# Patient Record
Sex: Female | Born: 1990 | Race: White | Hispanic: No | Marital: Married | Smoking: Never smoker
Health system: Southern US, Community
[De-identification: ages and names within clinical notes are randomized; demographics above are authoritative.]

## PROBLEM LIST (undated history)

## (undated) ENCOUNTER — Inpatient Hospital Stay (HOSPITAL_COMMUNITY): Payer: Self-pay

## (undated) DIAGNOSIS — R002 Palpitations: Secondary | ICD-10-CM

## (undated) DIAGNOSIS — L509 Urticaria, unspecified: Secondary | ICD-10-CM

## (undated) DIAGNOSIS — K2270 Barrett's esophagus without dysplasia: Secondary | ICD-10-CM

## (undated) DIAGNOSIS — G43909 Migraine, unspecified, not intractable, without status migrainosus: Secondary | ICD-10-CM

## (undated) DIAGNOSIS — F411 Generalized anxiety disorder: Secondary | ICD-10-CM

## (undated) DIAGNOSIS — K449 Diaphragmatic hernia without obstruction or gangrene: Secondary | ICD-10-CM

## (undated) DIAGNOSIS — F32A Depression, unspecified: Secondary | ICD-10-CM

## (undated) DIAGNOSIS — F419 Anxiety disorder, unspecified: Secondary | ICD-10-CM

## (undated) DIAGNOSIS — K219 Gastro-esophageal reflux disease without esophagitis: Secondary | ICD-10-CM

## (undated) DIAGNOSIS — L501 Idiopathic urticaria: Secondary | ICD-10-CM

## (undated) HISTORY — PX: TONSILLECTOMY: SUR1361

## (undated) HISTORY — DX: Urticaria, unspecified: L50.9

## (undated) HISTORY — DX: Diaphragmatic hernia without obstruction or gangrene: K44.9

## (undated) HISTORY — DX: Gastro-esophageal reflux disease without esophagitis: K21.9

## (undated) HISTORY — PX: WISDOM TOOTH EXTRACTION: SHX21

## (undated) HISTORY — PX: MYRINGOTOMY WITH TUBE PLACEMENT: SHX5663

## (undated) HISTORY — DX: Barrett's esophagus without dysplasia: K22.70

---
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
## 2011-10-12 ENCOUNTER — Emergency Department (HOSPITAL_COMMUNITY)
Admission: EM | Admit: 2011-10-12 | Discharge: 2011-10-13 | Disposition: A | Discharge status: Home / Self Care | Payer: PRIVATE HEALTH INSURANCE | Attending: Emergency Medicine | Admitting: Emergency Medicine

## 2011-10-12 ENCOUNTER — Encounter (HOSPITAL_COMMUNITY): Payer: Self-pay | Admitting: *Deleted

## 2011-10-12 DIAGNOSIS — M542 Cervicalgia: Secondary | ICD-10-CM | POA: Insufficient documentation

## 2011-10-12 MED ORDER — DEXAMETHASONE SODIUM PHOSPHATE 10 MG/ML IJ SOLN
10.0000 mg | Freq: Once | INTRAMUSCULAR | Status: AC
  Administered 2011-10-12: 10 mg via INTRAMUSCULAR
  Filled 2011-10-12: qty 1

## 2011-10-12 MED ORDER — DIAZEPAM 5 MG PO TABS
5.0000 mg | ORAL_TABLET | Freq: Once | ORAL | Status: AC
  Administered 2011-10-12: 5 mg via ORAL
  Filled 2011-10-12: qty 1

## 2011-10-12 MED ORDER — KETOROLAC TROMETHAMINE 60 MG/2ML IM SOLN
60.0000 mg | Freq: Once | INTRAMUSCULAR | Status: AC
  Administered 2011-10-12: 60 mg via INTRAMUSCULAR
  Filled 2011-10-12: qty 2

## 2011-10-12 NOTE — ED Provider Notes (Signed)
History     CSN: [id number]  Arrival date & time 10/12/11  2039   First MD Initiated Contact with Patient 10/12/11 2308     11:29 PM HPI Patient reports yesterday began having left-sided neck pain. Reports only activity that may have aggravated this is lifting heavy boxes. Reports severe pain on left side of neck with bending chin to chest and turning her head to the left. Denies eye pain, headache, fever, midline neck pain Patient is a 21 y.o. female presenting with neck injury. The history is provided by the patient.  Neck Injury This is a new problem. The current episode started yesterday. The problem occurs constantly. The problem has been gradually worsening. Associated symptoms include neck pain. Pertinent negatives include no abdominal pain, chest pain, chills, coughing, fatigue, fever, headaches, nausea, numbness, rash, sore throat, vomiting or weakness. Exacerbated by: Movement of head. She has tried acetaminophen, NSAIDs, ice, heat and rest for the symptoms. The treatment provided no relief.    History reviewed. No pertinent past medical history.  Past Surgical History  Procedure Date  . Tonsillectomy     No family history on file.  History  Substance Use Topics  . Smoking status: Never Smoker   . Smokeless tobacco: Not on file  . Alcohol Use: Yes     occasionally    OB History    Grav Para Term Preterm Abortions TAB SAB Ect Mult Living                  Review of Systems  Constitutional: Negative for fever, chills and fatigue.  HENT: Positive for neck pain and neck stiffness (left side). Negative for sore throat.   Eyes: Negative for pain.  Respiratory: Negative for cough and shortness of breath.   Cardiovascular: Negative for chest pain.  Gastrointestinal: Negative for nausea, vomiting, abdominal pain and diarrhea.  Skin: Negative for rash.  Neurological: Positive for light-headedness. Negative for seizures, weakness, numbness and headaches.  All other systems  reviewed and are negative.    Allergies  Review of patient's allergies indicates no known allergies.  Home Medications  No current outpatient prescriptions on file.  BP 124/74  Pulse 84  Temp(Src) 98.2 F (36.8 C) (Oral)  Resp 18  SpO2 100%  LMP 08/28/2011  Physical Exam  Vitals reviewed. Constitutional: She is oriented to person, place, and time. Vital signs are normal. She appears well-developed and well-nourished.  HENT:  Head: Normocephalic and atraumatic.  Eyes: Conjunctivae are normal. Pupils are equal, round, and reactive to light.  Neck: Neck supple. No spinous process tenderness and no muscular tenderness present. No rigidity. Decreased range of motion present. No edema present.       Patient has full range of motion of neck. No meningeal signs. Pain worsens with movement of head course the left the chest. Patient does not however have midline neck tenderness., weakness no numbness, tingling, weakness distally.  Cardiovascular: Normal rate, regular rhythm and normal heart sounds.  Exam reveals no friction rub.   No murmur heard. Pulmonary/Chest: Effort normal and breath sounds normal. She has no wheezes. She has no rhonchi. She has no rales. She exhibits no tenderness.  Abdominal: Soft. Bowel sounds are normal.  Neurological: She is alert and oriented to person, place, and time. Coordination normal.  Skin: Skin is warm and dry. No rash noted. No erythema. No pallor.    ED Course  Procedures   MDM   Minimal relief with pain. Advise conservative treatment with anti-inflammatory  medication, muscle relaxers, steroids, and analgesics. Patient started using ice and warm compresses gentleman so much. Advised no improvement in one to 2 weeks to call orthopedic physician for further evaluation. Patient has no radicular signs low suspicion for disc herniation.       Thomasene Lot, PA-C 10/13/11 [phone]

## 2011-10-12 NOTE — ED Notes (Signed)
Pt reports yesterday evening she began experiencing left sided neck pain - pt reports pain has gotten progressively worse and difficult to move her neck. Pt denies fever or body aches. Pt admits to HA today and x2 episodes of dizziness while at work when she stood up too fast.

## 2011-10-13 MED ORDER — NAPROXEN 500 MG PO TABS: 500.0000 mg | ORAL_TABLET | Freq: Two times a day (BID) | ORAL | Status: AC

## 2011-10-13 MED ORDER — HYDROCODONE-ACETAMINOPHEN 5-325 MG PO TABS: 1.0000 | ORAL_TABLET | Freq: Four times a day (QID) | ORAL | Status: AC | PRN

## 2011-10-13 MED ORDER — PREDNISONE (PAK) 10 MG PO TABS: ORAL_TABLET | ORAL | Status: AC

## 2011-10-13 MED ORDER — METHOCARBAMOL 500 MG PO TABS: 500.0000 mg | ORAL_TABLET | Freq: Two times a day (BID) | ORAL | Status: AC

## 2011-10-13 NOTE — ED Provider Notes (Signed)
Medical screening examination/treatment/procedure(s) were performed by non-physician practitioner and as supervising physician I was immediately available for consultation/collaboration.   Vida Roller, MD 10/13/11 [phone]

## 2013-07-31 ENCOUNTER — Emergency Department
Admission: EM | Admit: 2013-07-31 | Discharge: 2013-07-31 | Disposition: A | Discharge status: Home / Self Care | Payer: BC Managed Care – PPO | Source: Home / Self Care | Attending: Emergency Medicine | Admitting: Emergency Medicine

## 2013-07-31 ENCOUNTER — Encounter: Payer: Self-pay | Admitting: Emergency Medicine

## 2013-07-31 DIAGNOSIS — R5381 Other malaise: Secondary | ICD-10-CM

## 2013-07-31 DIAGNOSIS — IMO0001 Reserved for inherently not codable concepts without codable children: Secondary | ICD-10-CM

## 2013-07-31 HISTORY — DX: Palpitations: R00.2

## 2013-07-31 LAB — POCT URINALYSIS DIP (MANUAL ENTRY)
Blood, UA: NEGATIVE
Glucose, UA: NEGATIVE
Ketones, POC UA: NEGATIVE
Leukocytes, UA: NEGATIVE
Nitrite, UA: NEGATIVE
Protein Ur, POC: NEGATIVE
Spec Grav, UA: 1.025 (ref 1.005–1.03)
Urobilinogen, UA: 0.2 (ref 0–1)
pH, UA: 6.5 (ref 5–8)

## 2013-07-31 LAB — POCT URINE PREGNANCY: Preg Test, Ur: NEGATIVE

## 2013-07-31 MED ORDER — PREDNISONE (PAK) 5 MG PO TABS: 5.0000 mg | ORAL_TABLET | Freq: Four times a day (QID) | ORAL | Status: DC

## 2013-07-31 NOTE — ED Notes (Signed)
Pt c/o LT shoulder, mid back, RT side of her neck, and LT hip area pain x 1 mth, worse x 2 wks. She also c/o nausea x 2 days. Denies injury. She reports she went to [facility] and had xrays of her cspine that were normal.

## 2013-07-31 NOTE — ED Provider Notes (Signed)
CSN: [id number]     Arrival date & time 07/31/13  1508 History   First MD Initiated Contact with Patient 07/31/13 1517     Chief Complaint  Patient presents with  . Muscle Pain   (Consider location/radiation/quality/duration/timing/severity/associated sxs/prior Treatment) HPI Previously healthy 22 year old white female comes in today complaining of left shoulder pain.  It began about a month ago and she described it as pain to very light touch and the feeling of a bruise.  Full range of motion.  Then over the last month pain has continued in the shoulder but has gone to different areas on her body including her mid back left hip and right side as well.  She describes some chills some night sweats and some weight loss.  No accidents or trauma.  Mild fatigue.  No recent travel.  Some nausea but no vomiting or diarrhea.  She has a history of ovarian cysts and heart palpitations and a family history of non-Hodgkin's lymphoma.  Past Medical History  Diagnosis Date  . Heart palpitations    Past Surgical History  Procedure Laterality Date  . Tonsillectomy     Family History  Problem Relation Age of Onset  . Vitamin D deficiency Mother   . Non-Hodgkin's lymphoma Other    History  Substance Use Topics  . Smoking status: Never Smoker   . Smokeless tobacco: Not on file  . Alcohol Use: Yes     Comment: occasionally   OB History   Grav Para Term Preterm Abortions TAB SAB Ect Mult Living                 Review of Systems  All other systems reviewed and are negative.    Allergies  Review of patient's allergies indicates no known allergies.  Home Medications   Current Outpatient Rx  Name  Route  Sig  Dispense  Refill  . predniSONE (STERAPRED UNI-PAK) 5 MG TABS tablet   Oral   Take 1 tablet (5 mg total) by mouth taper from 4 doses each day to 1 dose and stop.   21 tablet   0    BP 113/78  Pulse 87  Temp(Src) 98.1 F (36.7 C) (Oral)  Resp 16  Ht 5\' 7"  (1.702 m)  Wt 117 lb  (53.071 kg)  BMI 18.32 kg/m2  SpO2 100% Physical Exam  Nursing note and vitals reviewed. Constitutional: She is oriented to person, place, and time. She appears well-developed and well-nourished.  Non-toxic appearance. She does not have a sickly appearance. No distress.  HENT:  Head: Normocephalic and atraumatic.  Nose: Nose normal.  Mouth/Throat: Uvula is midline and oropharynx is clear and moist.  Eyes: No scleral icterus.  Neck: Neck supple.  Cardiovascular: Normal rate, regular rhythm and normal heart sounds.   Pulmonary/Chest: Effort normal and breath sounds normal. No respiratory distress. She has no decreased breath sounds. She has no wheezes.  Musculoskeletal:  Left shoulder full range of motion, resists motions are painful, distal neurovascular status intact.  Lymphadenopathy:  No lymphadenopathy felt  Neurological: She is alert and oriented to person, place, and time.  Skin: Skin is warm and dry.  No rashes seen.  She does have tenderness to very light palpation of the left shoulder and scattered areas on her body and she points to them.  Psychiatric: She has a normal mood and affect. Her speech is normal.    ED Course  Procedures (including critical care time) Labs Review Labs Reviewed  COMPREHENSIVE METABOLIC PANEL  FERRITIN  URINALYSIS W MICROSCOPIC + REFLEX CULTURE  CBC WITH DIFFERENTIAL  TSH  VITAMIN B12  SEDIMENTATION RATE   Imaging Review No results found.  EKG Interpretation     Ventricular Rate:    PR Interval:    QRS Duration:   QT Interval:    QTC Calculation:   R Axis:     Text Interpretation:              MDM   1. Other malaise and fatigue   2. Myalgia and myositis    Due to the patient's vague and random pains, multiple ROS complaints, I will first obtain some labs including CBC, CMP, TSH, vitamin D, vitamin B12, [place] spotted fever and Lyme titers, sedimentation rate was I also gave her some prednisone to take as a tapering  dose for the next 6 days indications having some type of neuralgia.  She is to follow up with primary care physician.  We will call her back with lab results in a few days..  she may need followup with a neurologist or a rheumatologist if her symptoms continue.  UA is normal and UPT is negative.  Marlaine Hind, MD 07/31/13 [phone]

## 2013-08-01 ENCOUNTER — Ambulatory Visit (INDEPENDENT_AMBULATORY_CARE_PROVIDER_SITE_OTHER): Payer: BC Managed Care – PPO | Admitting: Sports Medicine

## 2013-08-01 ENCOUNTER — Telehealth: Payer: Self-pay | Admitting: *Deleted

## 2013-08-01 ENCOUNTER — Encounter: Payer: Self-pay | Admitting: Sports Medicine

## 2013-08-01 VITALS — BP 105/72 | HR 74 | Wt 117.0 lb

## 2013-08-01 DIAGNOSIS — F419 Anxiety disorder, unspecified: Secondary | ICD-10-CM | POA: Insufficient documentation

## 2013-08-01 DIAGNOSIS — F411 Generalized anxiety disorder: Secondary | ICD-10-CM

## 2013-08-01 DIAGNOSIS — M5412 Radiculopathy, cervical region: Secondary | ICD-10-CM | POA: Insufficient documentation

## 2013-08-01 DIAGNOSIS — M545 Low back pain, unspecified: Secondary | ICD-10-CM

## 2013-08-01 DIAGNOSIS — IMO0001 Reserved for inherently not codable concepts without codable children: Secondary | ICD-10-CM

## 2013-08-01 LAB — COMPREHENSIVE METABOLIC PANEL
ALT: 10 U/L (ref 0–35)
AST: 20 U/L (ref 0–37)
Albumin: 4.8 g/dL (ref 3.5–5.2)
Alkaline Phosphatase: 58 U/L (ref 39–117)
BUN: 8 mg/dL (ref 6–23)
CO2: 25 mEq/L (ref 19–32)
Calcium: 10 mg/dL (ref 8.4–10.5)
Chloride: 103 mEq/L (ref 96–112)
Creat: 0.77 mg/dL (ref 0.50–1.10)
Glucose, Bld: 74 mg/dL (ref 70–99)
Sodium: 138 mEq/L (ref 135–145)
Total Bilirubin: 0.7 mg/dL (ref 0.3–1.2)
Total Protein: 7.7 g/dL (ref 6.0–8.3)

## 2013-08-01 LAB — CBC WITH DIFFERENTIAL/PLATELET
Basophils Absolute: 0 10*3/uL (ref 0.0–0.1)
Basophils Relative: 1 % (ref 0–1)
Eosinophils Absolute: 0.5 10*3/uL (ref 0.0–0.7)
Eosinophils Relative: 8 % — ABNORMAL HIGH (ref 0–5)
HCT: 41.7 % (ref 36.0–46.0)
Hemoglobin: 14.5 g/dL (ref 12.0–15.0)
Lymphs Abs: 2.2 10*3/uL (ref 0.7–4.0)
MCH: 29.8 pg (ref 26.0–34.0)
MCHC: 34.8 g/dL (ref 30.0–36.0)
Monocytes Absolute: 0.5 10*3/uL (ref 0.1–1.0)
Monocytes Relative: 8 % (ref 3–12)
Neutro Abs: 3.3 10*3/uL (ref 1.7–7.7)
Platelets: 330 10*3/uL (ref 150–400)
RDW: 13 % (ref 11.5–15.5)
WBC: 6.6 10*3/uL (ref 4.0–10.5)

## 2013-08-01 LAB — ROCKY MTN SPOTTED FVR AB, IGG-BLOOD: RMSF IgG: 0.19 IV

## 2013-08-01 LAB — SEDIMENTATION RATE: Sed Rate: 1 mm/hr (ref 0–22)

## 2013-08-01 LAB — FERRITIN: Ferritin: 21 ng/mL (ref 10–291)

## 2013-08-01 LAB — VITAMIN B12: Vitamin B-12: 359 pg/mL (ref 211–911)

## 2013-08-01 LAB — TSH: TSH: 1.282 u[IU]/mL (ref 0.350–4.500)

## 2013-08-01 LAB — ROCKY MTN SPOTTED FVR AB, IGM-BLOOD: ROCKY MTN SPOTTED FEVER, IGM: 0.36 IV

## 2013-08-01 MED ORDER — CITALOPRAM HYDROBROMIDE 20 MG PO TABS: 20.0000 mg | ORAL_TABLET | Freq: Every day | ORAL | Status: DC

## 2013-08-01 NOTE — Assessment & Plan Note (Signed)
I think that many of her orthopedic symptoms are related to her very stressful life with her job and school. Starting citalopram. Return in 2 weeks. She does understand this will not work until the 4 to 6 week mark.

## 2013-08-01 NOTE — Progress Notes (Signed)
  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 It began 1 month ago and is worse with forward flexion. It radiates from the armpit down into the pinkie finger. She also has an area of severe tenderness in the left middle back. This is bothersome at night and during long car rides. She went to urgent care when the pain initially began and was given a muscle relaxer. She took it for three days only because she felt very groggy. She was also given hydrocodone that caused her to vomit. 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 She feels stressed and anxious about her many commitments. She feels tired because she does not often have enough time to sleep, but her mood is good. No changes in her interest when she has time to do stuff, no feelings of guilt, concentration is only mildly affected, appetite is okay, no suicidal or homicidal ideation.  Past medical history, Surgical history, Family history not pertinant except as noted below, Social history, Allergies, and medications have been entered into the medical record, reviewed, and no changes needed.   Review of Systems: No headache, visual changes, nausea, vomiting, diarrhea, constipation, dizziness, abdominal pain, skin rash, fevers, chills, night sweats, swollen lymph nodes, weight loss, chest pain, body aches, joint swelling, muscle aches, shortness of breath, mood changes, visual or auditory hallucinations.  Objective:    General: Well Developed, well nourished, and in no acute distress.  Neuro: Alert and oriented x3, extra-ocular muscles intact, sensation grossly intact.  HEENT: Normocephalic, atraumatic, pupils equal round reactive to light, neck supple, no masses, no lymphadenopathy, thyroid nonpalpable.  Skin: Warm and dry, no rashes noted.  Cardiac: Regular rate and rhythm, no murmurs rubs or  gallops.  Respiratory: Clear to auscultation bilaterally. Not using accessory muscles, speaking in full sentences.  Abdominal: Soft, nontender, nondistended, positive bowel sounds, no masses, no organomegaly.  Neck: Inspection unremarkable. Tender to palpation along her lower cervical spine, and upper thoracic spine, on the left paraspinal muscles. Negative Spurling's maneuver. Full neck range of motion Grip strength and sensation normal in bilateral hands Strength good C4 to T1 distribution No sensory change to C4 to T1 Negative Hoffman sign bilaterally Reflexes normal  Procedure:  Injection of left paracervical trigger point. Consent obtained and verified. Time-out conducted. Noted no overlying erythema, induration, or other signs of local infection. Skin prepped in a sterile fashion. Topical analgesic spray: Ethyl chloride. Completed without difficulty. Meds: A total of 0.5 cc Kenalog 40, 1 cc lidocaine injected in a fanlike pattern to the spasming muscle. Pain immediately improved suggesting accurate placement of the medication. Advised to call if fevers/chills, erythema, induration, drainage, or persistent bleeding.  Impression and Recommendations:   The patient was counselled, risk factors were discussed, anticipatory guidance given.  Assessment: This is a 22 year old female with symptoms of C8 radiculitis. This appears to be due to muscle spasms, possibly related to anxiety and stress. Plan: 1. Trigger point injection today 2. Complete home PT exercises daily 3. Citalopram for anxiety 4. Return for follow-up in 2 weeks  This note was originally written by Karin Lieu MS3.

## 2013-08-01 NOTE — Assessment & Plan Note (Signed)
Trigger point injection into spasm muscle. Home exercises given. She does have prednisone which she has not yet taken. Return to see me in 2 weeks.

## 2013-08-01 NOTE — Assessment & Plan Note (Signed)
Left C8. Home exercises given. She does have prednisone which he is not taking. I also think that starting citalopram which will improve her stress, will also help her neck and back pain.

## 2013-08-02 LAB — LYME DISEASE DNA BY PCR(BORRELIA BURG): B burgdorferi DNA: NOT DETECTED

## 2013-08-05 LAB — VITAMIN D PNL(25-HYDRXY+1,25-DIHY)-BLD
Vit D, 25-Hydroxy: 50 ng/mL (ref 30–89)
Vitamin D 1, 25 (OH)2 Total: 72 pg/mL (ref 18–72)
Vitamin D2 1, 25 (OH)2: <8 pg/mL
Vitamin D3 1, 25 (OH)2: 72 pg/mL

## 2013-08-15 ENCOUNTER — Ambulatory Visit: Payer: BC Managed Care – PPO | Admitting: Sports Medicine

## 2013-08-15 DIAGNOSIS — Z0289 Encounter for other administrative examinations: Secondary | ICD-10-CM

## 2013-12-04 ENCOUNTER — Encounter: Payer: Self-pay | Admitting: Physician Assistant

## 2013-12-04 ENCOUNTER — Ambulatory Visit (INDEPENDENT_AMBULATORY_CARE_PROVIDER_SITE_OTHER): Payer: BC Managed Care – PPO | Admitting: Physician Assistant

## 2013-12-04 VITALS — BP 116/67 | HR 83 | Temp 97.8°F | Wt 127.0 lb

## 2013-12-04 DIAGNOSIS — A499 Bacterial infection, unspecified: Secondary | ICD-10-CM

## 2013-12-04 DIAGNOSIS — B9689 Other specified bacterial agents as the cause of diseases classified elsewhere: Secondary | ICD-10-CM

## 2013-12-04 DIAGNOSIS — J329 Chronic sinusitis, unspecified: Secondary | ICD-10-CM

## 2013-12-04 MED ORDER — AMOXICILLIN 500 MG PO CAPS: 500.0000 mg | ORAL_CAPSULE | Freq: Two times a day (BID) | ORAL | Status: DC

## 2013-12-04 MED ORDER — METHYLPREDNISOLONE SODIUM SUCC 125 MG IJ SOLR
125.0000 mg | Freq: Once | INTRAMUSCULAR | Status: AC
  Administered 2013-12-04: 125 mg via INTRAMUSCULAR

## 2013-12-04 MED ORDER — FLUCONAZOLE 150 MG PO TABS: 150.0000 mg | ORAL_TABLET | Freq: Once | ORAL | Status: DC

## 2013-12-04 NOTE — Progress Notes (Addendum)
   Subjective:    Patient ID: Jennifer Bell, female    DOB: 05-Dec-1990, 23 y.o.   MRN: [id number]  HPI Patient is a 23 year old female who presents to the clinic with bilateral maxillary and frontal sinus pressure tenderness, headache, sore throat. She's had symptoms on and off for the last 3 weeks. She felt like she just getting a cold. Over the past week her symptoms have worsened. She is to the point now her she has a continual headache and she has drainage going down the back of her throat causing a sore throat. She denies any fever, chills, nausea or wheezing. She does have a dry cough. She has some ear congestion but no pain.  She has tried over-the-counter ibuprofen, Alka-Seltzer and Mucinex. She has not had any relief this week with those treatments.    Review of Systems     Objective:   Physical Exam  Constitutional: She is oriented to person, place, and time. She appears well-developed and well-nourished.  HENT:  Head: Normocephalic and atraumatic.  Right Ear: External ear normal.  Left Ear: External ear normal.  TM's injected bilaterally.   Oropharynx is erythematous with post nasal drip going down back of throat. Tonsils are normal size  Bilateral maxillary and frontal sinus tenderness to palpation.  Bilateral nasal turbinates red and swollen.  Eyes: Conjunctivae are normal. Right eye exhibits no discharge. Left eye exhibits no discharge.  Neck: Normal range of motion. Neck supple.  Right-sided lymphadenopathy in the anterior cervical region. Tender to palpate.  Cardiovascular: Normal rate, regular rhythm and normal heart sounds.   Pulmonary/Chest: Effort normal and breath sounds normal. She has no wheezes.  Neurological: She is alert and oriented to person, place, and time.  Skin: Skin is dry.  Psychiatric: She has a normal mood and affect. Her behavior is normal.          Assessment & Plan:  Bacterial sinusitis-since symptoms have been going on for 3 weeks  when ahead and get a shot of slight Medrol 125 mg in office today. Patient was also sent home with Amoxil for 10 days. Patient has history of yeast infections after antibiotics. She was given within prophylactically. She was also educated that antibiotics could interfere with birth control and to use a secondary birth control method up to 7 days after finishing antibiotic. Continue to use ibuprofen for pain control. If not improving the office a call.  After injection pt got hot and sweaty. Water was given and 400mg  of ibuprofen. Pt sat for 10 minutes and left without any concerns.

## 2013-12-04 NOTE — Patient Instructions (Signed)

## 2014-02-11 ENCOUNTER — Encounter: Payer: Self-pay | Admitting: Family Medicine

## 2014-02-11 ENCOUNTER — Ambulatory Visit (INDEPENDENT_AMBULATORY_CARE_PROVIDER_SITE_OTHER): Payer: BC Managed Care – PPO | Admitting: Family Medicine

## 2014-02-11 VITALS — BP 123/78 | HR 93 | Temp 97.6°F | Wt 124.0 lb

## 2014-02-11 DIAGNOSIS — B9689 Other specified bacterial agents as the cause of diseases classified elsewhere: Secondary | ICD-10-CM

## 2014-02-11 DIAGNOSIS — A499 Bacterial infection, unspecified: Secondary | ICD-10-CM

## 2014-02-11 DIAGNOSIS — J329 Chronic sinusitis, unspecified: Secondary | ICD-10-CM

## 2014-02-11 MED ORDER — DOXYCYCLINE HYCLATE 100 MG PO TABS: ORAL_TABLET | ORAL | Status: AC

## 2014-02-11 MED ORDER — FLUCONAZOLE 150 MG PO TABS: ORAL_TABLET | ORAL | Status: AC

## 2014-02-11 NOTE — Progress Notes (Signed)
CC: Jennifer Bell is a 23 y.o. female is here for URI   Subjective: HPI:  Concerns of facial pressure localized in the left forehead and right maxillary region and has been present for a little over a week on a daily basis. Worsening on a daily basis. Moderate in severity. Accompanied by thick nasal discharge and nonproductive cough, worse when lying down. No improvement with continued use of Allegra, Flonase, Nettie pot.  Denies fevers, chills, motor or sensory disturbances, wheezing, productive cough, sore throat.   Review Of Systems Outlined In HPI  Past Medical History  Diagnosis Date  . Heart palpitations     Past Surgical History  Procedure Laterality Date  . Tonsillectomy    . Tonsillectomy     Family History  Problem Relation Age of Onset  . Vitamin D deficiency Mother   . Non-Hodgkin's lymphoma Other   . Depression Paternal Aunt   . Diabetes Maternal Grandmother   . Diabetes Maternal Grandfather   . Cancer Paternal Grandmother   . Cancer Paternal Grandfather   . Heart attack Paternal Grandfather     History   Social History  . Marital Status: Single    Spouse Name: N/A    Number of Children: N/A  . Years of Education: N/A   Occupational History  . Not on file.   Social History Main Topics  . Smoking status: Never Smoker   . Smokeless tobacco: Not on file  . Alcohol Use: Yes     Comment: occasionally  . Drug Use: No  . Sexual Activity: Yes    Birth Control/ Protection: Pill   Other Topics Concern  . Not on file   Social History Narrative  . No narrative on file     Objective: BP 123/78  Pulse 93  Temp(Src) 97.6 F (36.4 C) (Oral)  Wt 124 lb (56.246 kg)  General: Alert and Oriented, No Acute Distress HEENT: Pupils equal, round, reactive to light. Conjunctivae clear.  External ears unremarkable, canals clear with intact TMs with appropriate landmarks.  Middle ear appears open without effusion. Pink inferior turbinates.  Moist mucous  membranes, pharynx without inflammation nor lesions however moderate postnasal drip.  Neck supple without palpable lymphadenopathy nor abnormal masses. Lungs: Clear to auscultation bilaterally, no wheezing/ronchi/rales.  Comfortable work of breathing. Good air movement. Neuro: Cranial nerves 2-12 grossly intact Extremities: No peripheral edema.  Strong peripheral pulses.  Mental Status: No depression, anxiety, nor agitation. Skin: Warm and dry.  Assessment & Plan: Jennifer Bell was seen today for uri.  Diagnoses and associated orders for this visit:  Bacterial sinusitis - doxycycline (VIBRA-TABS) 100 MG tablet; One by mouth twice a day for ten days.  Other Orders - fluconazole (DIFLUCAN) 150 MG tablet; Take one tab, may take second tab if no improvement after 72 hours.    Bacterial sinusitis: Start doxycycline continue all antiallergy medications, fluconazole provided given history of yeast infections on antibiotics  Return if symptoms worsen or fail to improve.

## 2014-04-21 ENCOUNTER — Encounter: Payer: Self-pay | Admitting: Family Medicine

## 2014-04-21 ENCOUNTER — Ambulatory Visit (INDEPENDENT_AMBULATORY_CARE_PROVIDER_SITE_OTHER): Payer: BC Managed Care – PPO | Admitting: Family Medicine

## 2014-04-21 ENCOUNTER — Ambulatory Visit (INDEPENDENT_AMBULATORY_CARE_PROVIDER_SITE_OTHER): Payer: BC Managed Care – PPO

## 2014-04-21 VITALS — BP 117/77 | HR 86 | Ht 67.0 in | Wt 121.0 lb

## 2014-04-21 DIAGNOSIS — M25571 Pain in right ankle and joints of right foot: Secondary | ICD-10-CM

## 2014-04-21 DIAGNOSIS — Z4802 Encounter for removal of sutures: Secondary | ICD-10-CM

## 2014-04-21 DIAGNOSIS — M25579 Pain in unspecified ankle and joints of unspecified foot: Secondary | ICD-10-CM

## 2014-04-21 DIAGNOSIS — B354 Tinea corporis: Secondary | ICD-10-CM

## 2014-04-21 DIAGNOSIS — M7989 Other specified soft tissue disorders: Secondary | ICD-10-CM

## 2014-04-21 MED ORDER — CLOTRIMAZOLE 1 % EX CREA: TOPICAL_CREAM | CUTANEOUS | Status: AC

## 2014-04-21 MED ORDER — TRAMADOL HCL 50 MG PO TABS: 50.0000 mg | ORAL_TABLET | Freq: Three times a day (TID) | ORAL | Status: DC | PRN

## 2014-04-21 NOTE — Progress Notes (Signed)
CC: Jennifer Bell is a 23 y.o. female is here for Rash and Ankle Injury   Subjective: HPI:  Complains of a rash localized on the torso mostly on left lower quadrant and epigastric region has been present for the past week that is worse on a daily basis. Described as intensely itchy. No interventions as of yet. Denies pain. Denies any other skin changes.  Reports that approximately 9 days ago she dropped bottles of beer on her right ankle by accident one of which shattered upon impact of her ankle. She had a laceration that required simple interrupted sutures 9 days ago performed at a local urgent care office. Since this accident she has had persistent pain in the right ankle localized just below the laceration and also in the posterior calf. Pain is worse when extending the ankle or flexing the ankle. Pain radiates up the back of the calf and is described only has pain. Has been accompanied by swelling and bruising just below the right lateral malleolus. She denies any drainage or discharge or pain at the site of her laceration. She denies any personal history of blood clots. Denies rapid heartbeat shortness of breath fevers or chills. States that the pain overall is moderate to severe in severity when standing and moderate at rest despite use of aspirin every 8 hours   Review Of Systems Outlined In HPI  Past Medical History  Diagnosis Date  . Heart palpitations     Past Surgical History  Procedure Laterality Date  . Tonsillectomy    . Tonsillectomy     Family History  Problem Relation Age of Onset  . Vitamin D deficiency Mother   . Non-Hodgkin's lymphoma Other   . Depression Paternal Aunt   . Diabetes Maternal Grandmother   . Diabetes Maternal Grandfather   . Cancer Paternal Grandmother   . Cancer Paternal Grandfather   . Heart attack Paternal Grandfather     History   Social History  . Marital Status: Single    Spouse Name: N/A    Number of Children: N/A  . Years of  Education: N/A   Occupational History  . Not on file.   Social History Main Topics  . Smoking status: Never Smoker   . Smokeless tobacco: Not on file  . Alcohol Use: Yes     Comment: occasionally  . Drug Use: No  . Sexual Activity: Yes    Birth Control/ Protection: Pill   Other Topics Concern  . Not on file   Social History Narrative  . No narrative on file     Objective: BP 117/77  Pulse 86  Ht 5\' 7"  (1.702 m)  Wt 121 lb (54.885 kg)  BMI 18.95 kg/m2  LMP 03/31/2014  General: Alert and Oriented, No Acute Distress HEENT: Pupils equal, round, reactive to light. Conjunctivae clear.  Moist mucous membranes pharynx unremarkable Lungs: Clear comfortable work of breathing Cardiac: Regular rate and rhythm.  Extremities: No peripheral edema.  Strong peripheral pulses. Just below the right lateral malleoli there is moderate ecchymosis extending from the hindfoot to the midfoot, at this region there is also mild swelling which is nonpitting. Her calf pain is reproduced with resisted flexion of the right foot and resisted eversion. She has full strength and range of motion throughout the ankle. Posterior calf pain is also reproduced with palpation of the muscle belly. Her laceration just above the lateral malleoli appears clean, dry and intact without any discharge or pain with touch Mental Status: No  depression, anxiety, nor agitation. Skin: Warm and dry. Circular papular rash slightly erythematous with a central region completely spared approximately the size of a quarter localized in the left lower quadrant of the abdomen and a smaller similar lesion in the left upper quadrant of the abdomen  Assessment & Plan: Jennifer Bell was seen today for rash and ankle injury.  Diagnoses and associated orders for this visit:  Tinea corporis - clotrimazole (LOTRIMIN) 1 % cream; Apply to affected areas twice a day for up to four weeks, applying up to two weeks after resolution of symptoms.  Pain in  joint, ankle and foot, right - DG Foot Complete Right; Future - traMADol (ULTRAM) 50 MG tablet; Take 1 tablet (50 mg total) by mouth every 8 (eight) hours as needed.  Right leg swelling - D-dimer, quantitative  Visit for suture removal    Sutures were removed without difficulty or complication, Steri-Strips x2 were applied to be used only for the next 5 days Tinea corporis: Start clotrimazole Right ankle pain: X-ray was obtained given severity of her pain this far out from her injury, fortunately x-rays ruled out retained foreign body such as glass and no bony abnormality. Tramadol for ankle pain continue Advil 800 mg 3 times a day Right leg swelling: It seems that her calf is causing her so much pain given that it's quite distance from where the impact occurred, given the swelling in her right foot I would like her to have a d-dimer and order ultrasound only if positive to rule out DVT.   Return if symptoms worsen or fail to improve.

## 2014-04-22 LAB — D-DIMER, QUANTITATIVE (NOT AT ARMC): D DIMER QUANT: 0.27 ug{FEU}/mL (ref 0.00–0.48)

## 2014-05-28 ENCOUNTER — Encounter: Payer: Self-pay | Admitting: Physician Assistant

## 2014-05-28 ENCOUNTER — Ambulatory Visit (INDEPENDENT_AMBULATORY_CARE_PROVIDER_SITE_OTHER): Payer: BC Managed Care – PPO | Admitting: Physician Assistant

## 2014-05-28 VITALS — BP 99/60 | HR 85 | Temp 97.9°F | Ht 67.0 in | Wt 120.0 lb

## 2014-05-28 DIAGNOSIS — K2100 Gastro-esophageal reflux disease with esophagitis, without bleeding: Secondary | ICD-10-CM | POA: Insufficient documentation

## 2014-05-28 DIAGNOSIS — R112 Nausea with vomiting, unspecified: Secondary | ICD-10-CM

## 2014-05-28 DIAGNOSIS — M25579 Pain in unspecified ankle and joints of unspecified foot: Secondary | ICD-10-CM

## 2014-05-28 DIAGNOSIS — R1013 Epigastric pain: Secondary | ICD-10-CM | POA: Diagnosis not present

## 2014-05-28 DIAGNOSIS — R1084 Generalized abdominal pain: Secondary | ICD-10-CM | POA: Insufficient documentation

## 2014-05-28 DIAGNOSIS — M25571 Pain in right ankle and joints of right foot: Secondary | ICD-10-CM

## 2014-05-28 DIAGNOSIS — K449 Diaphragmatic hernia without obstruction or gangrene: Secondary | ICD-10-CM | POA: Insufficient documentation

## 2014-05-28 MED ORDER — PROMETHAZINE HCL 25 MG/ML IJ SOLN
12.5000 mg | Freq: Once | INTRAMUSCULAR | Status: AC
  Administered 2014-05-28: 12.5 mg via INTRAMUSCULAR

## 2014-05-28 MED ORDER — TRAMADOL HCL 50 MG PO TABS: 50.0000 mg | ORAL_TABLET | Freq: Three times a day (TID) | ORAL | Status: DC | PRN

## 2014-05-28 MED ORDER — OMEPRAZOLE 40 MG PO CPDR: 40.0000 mg | DELAYED_RELEASE_CAPSULE | Freq: Two times a day (BID) | ORAL | Status: DC

## 2014-05-28 NOTE — Progress Notes (Addendum)
   Subjective:    Patient ID: Jennifer Bell, female    DOB: Nov 18, 1990, 23 y.o.   MRN: [id number]  HPI Pt is a 23 yo female who presents to the clinic with ongoing nausea, vomiting and abdominal pain. She went to ED yesterday 05/27/14. CT was negative for any acute process. UA was positive for nitrates. Culture pending. kelfex was started. CBC, WBC, lipase was all normal range. Upon further questioning she also feels a burning sensation coming from her stomach and up into her mouth. She does have ST today. No fever or chills. She is able to drink but eating makes worse. She has generalized abdominal pain but worse in lower and upper quadrants. She does admit to taking NSAIDs more regularly due to more frequent headaches. No diarrhea or constipation. No blood or black tarry stools. She did vomit this am.    Review of Systems  All other systems reviewed and are negative.      Objective:   Physical Exam  Constitutional: She is oriented to person, place, and time. She appears well-developed and well-nourished.  HENT:  Head: Normocephalic and atraumatic.  Cardiovascular: Normal rate, regular rhythm and normal heart sounds.   Pulmonary/Chest: Effort normal and breath sounds normal. She has no wheezes.  No CVA tenderness.   Abdominal: She exhibits no mass.  Distention noted over abdomen.  Guarding and pain with palpation over left upper quadrant.  Pain also over left and right lower quadrant to palpation.  No rebound or pain over RUQ.   Neurological: She is alert and oriented to person, place, and time.  Skin: Skin is dry.  Psychiatric: She has a normal mood and affect. Her behavior is normal.          Assessment & Plan:  GERD/Epigastric pain/UTI/lower abdominal pain- unclear etiology. Vitals are stable. CT scan negative yesterday. I feel like more than one thing might be going on. Certainly she does have UTI. Continue on keflex. Will wait for culture to confirm if bacteria will  respond to keflex. GI cocktail given today with some benefit. I feel like symptoms are consistent with gastritis. Given omeprazole to start  bid for 2 weeks then decrease to every morning. Will recheck CBC and test for h.pylori. Im phenergan 12.5mg  given today. Continue to take oral as needed every 4-6 hours. StoP all NSAIdS could be creating gastritis. Tylenol or tramadol for pain. GERD diet given. Encouraged to stay hydrated but advance diet only as tolerated. Follow up in 1 week. Red flags or worsening symptoms please also call office.   Spent 30 minutes with patient and greater than 50 percent of visit spent counseling patient regarding treatment plan.

## 2014-05-28 NOTE — Patient Instructions (Addendum)
Gastritis, Adult Gastritis is soreness and swelling (inflammation) of the lining of the stomach. Gastritis can develop as a sudden onset (acute) or long-term (chronic) condition. If gastritis is not treated, it can lead to stomach bleeding and ulcers. CAUSES  Gastritis occurs when the stomach lining is weak or damaged. Digestive juices from the stomach then inflame the weakened stomach lining. The stomach lining may be weak or damaged due to viral or bacterial infections. One common bacterial infection is the Helicobacter pylori infection. Gastritis can also result from excessive alcohol consumption, taking certain medicines, or having too much acid in the stomach.  SYMPTOMS  In some cases, there are no symptoms. When symptoms are present, they may include:  Pain or a burning sensation in the upper abdomen.  Nausea.  Vomiting.  An uncomfortable feeling of fullness after eating. DIAGNOSIS  Your caregiver may suspect you have gastritis based on your symptoms and a physical exam. To determine the cause of your gastritis, your caregiver may perform the following:  Blood or stool tests to check for the H pylori bacterium.  Gastroscopy. A thin, flexible tube (endoscope) is passed down the esophagus and into the stomach. The endoscope has a light and camera on the end. Your caregiver uses the endoscope to view the inside of the stomach.  Taking a tissue sample (biopsy) from the stomach to examine under a microscope. TREATMENT  Depending on the cause of your gastritis, medicines may be prescribed. If you have a bacterial infection, such as an H pylori infection, antibiotics may be given. If your gastritis is caused by too much acid in the stomach, H2 blockers or antacids may be given. Your caregiver may recommend that you stop taking aspirin, ibuprofen, or other nonsteroidal anti-inflammatory drugs (NSAIDs). HOME CARE INSTRUCTIONS  Only take over-the-counter or prescription medicines as directed by  your caregiver.  If you were given antibiotic medicines, take them as directed. Finish them even if you start to feel better.  Drink enough fluids to keep your urine clear or pale yellow.  Avoid foods and drinks that make your symptoms worse, such as:  Caffeine or alcoholic drinks.  Chocolate.  Peppermint or mint flavorings.  Garlic and onions.  Spicy foods.  Citrus fruits, such as oranges, lemons, or limes.  Tomato-based foods such as sauce, chili, salsa, and pizza.  Fried and fatty foods.  Eat small, frequent meals instead of large meals. SEEK IMMEDIATE MEDICAL CARE IF:   You have black or dark red stools.  You vomit blood or material that looks like coffee grounds.  You are unable to keep fluids down.  Your abdominal pain gets worse.  You have a fever.  You do not feel better after 1 week.  You have any other questions or concerns. MAKE SURE YOU:  Understand these instructions.  Will watch your condition.  Will get help right away if you are not doing well or get worse. Document Released: 09/13/2001 Document Revised: 03/20/2012 Document Reviewed: 11/02/2011 [employer] Patient Information 2015 [employer]. This information is not intended to replace advice given to you by your health care provider. Make sure you discuss any questions you have with your health care provider.  Food Choices for Gastroesophageal Reflux Disease When you have gastroesophageal reflux disease (GERD), the foods you eat and your eating habits are very important. Choosing the right foods can help ease the discomfort of GERD. WHAT GENERAL GUIDELINES DO I NEED TO FOLLOW?  Choose fruits, vegetables, whole grains, low-fat dairy products, and low-fat   meat, fish, and poultry.  Limit fats such as oils, salad dressings, butter, nuts, and avocado.  Keep a food diary to identify foods that cause symptoms.  Avoid foods that cause reflux. These may be different for different people.  Eat  frequent small meals instead of three large meals each day.  Eat your meals slowly, in a relaxed setting.  Limit fried foods.  Cook foods using methods other than frying.  Avoid drinking alcohol.  Avoid drinking large amounts of liquids with your meals.  Avoid bending over or lying down until 2-3 hours after eating. WHAT FOODS ARE NOT RECOMMENDED? The following are some foods and drinks that may worsen your symptoms: Vegetables Tomatoes. Tomato juice. Tomato and spaghetti sauce. Chili peppers. Onion and garlic. Horseradish. Fruits Oranges, grapefruit, and lemon (fruit and juice). Meats High-fat meats, fish, and poultry. This includes hot dogs, ribs, ham, sausage, salami, and bacon. Dairy Whole milk and chocolate milk. Sour cream. Cream. Butter. Ice cream. Cream cheese.  Beverages Coffee and tea, with or without caffeine. Carbonated beverages or energy drinks. Condiments Hot sauce. Barbecue sauce.  Sweets/Desserts Chocolate and cocoa. Donuts. Peppermint and spearmint. Fats and Oils High-fat foods, including French fries and potato chips. Other Vinegar. Strong spices, such as black pepper, white pepper, red pepper, cayenne, curry powder, cloves, ginger, and chili powder. The items listed above may not be a complete list of foods and beverages to avoid. Contact your dietitian for more information. Document Released: 09/19/2005 Document Revised: 09/24/2013 Document Reviewed: 07/24/2013 [employer] Patient Information 2015 [employer]. This information is not intended to replace advice given to you by your health care provider. Make sure you discuss any questions you have with your health care provider.   

## 2014-05-29 ENCOUNTER — Telehealth: Payer: Self-pay | Admitting: Sports Medicine

## 2014-05-29 LAB — CBC WITH DIFFERENTIAL/PLATELET
BASOS ABS: 0 10*3/uL (ref 0.0–0.1)
Basophils Relative: 1 % (ref 0–1)
Eosinophils Absolute: 0.2 10*3/uL (ref 0.0–0.7)
Eosinophils Relative: 4 % (ref 0–5)
HEMATOCRIT: 37.8 % (ref 36.0–46.0)
Hemoglobin: 13 g/dL (ref 12.0–15.0)
LYMPHS ABS: 1.1 10*3/uL (ref 0.7–4.0)
LYMPHS PCT: 26 % (ref 12–46)
MCH: 30.1 pg (ref 26.0–34.0)
MCHC: 34.4 g/dL (ref 30.0–36.0)
MCV: 87.5 fL (ref 78.0–100.0)
Monocytes Absolute: 0.4 10*3/uL (ref 0.1–1.0)
Monocytes Relative: 10 % (ref 3–12)
Neutro Abs: 2.6 10*3/uL (ref 1.7–7.7)
Neutrophils Relative %: 59 % (ref 43–77)
Platelets: 172 10*3/uL (ref 150–400)
RBC: 4.32 MIL/uL (ref 3.87–5.11)
RDW: 12.5 % (ref 11.5–15.5)
WBC: 4.4 10*3/uL (ref 4.0–10.5)

## 2014-05-29 LAB — H. PYLORI ANTIBODY, IGG: H Pylori IgG: 0.43 {ISR}

## 2014-05-29 NOTE — Telephone Encounter (Signed)
Pt called. She was prescribed Tramadol yesterday for Migraines but she is still no better and she also threw up last night. Can she be given something else to try.

## 2014-05-29 NOTE — Telephone Encounter (Signed)
Looking at the previous note it doesn't look like migraines, looks like gastroenteritis. Does she need any more nausea medicine, what are her symptom right now?

## 2014-05-31 ENCOUNTER — Telehealth: Payer: Self-pay | Admitting: Gastroenterology

## 2014-05-31 ENCOUNTER — Emergency Department (HOSPITAL_COMMUNITY)
Admission: EM | Admit: 2014-05-31 | Discharge: 2014-05-31 | Disposition: A | Discharge status: Home / Self Care | Payer: BC Managed Care – PPO | Attending: Emergency Medicine | Admitting: Emergency Medicine

## 2014-05-31 ENCOUNTER — Emergency Department (HOSPITAL_COMMUNITY): Payer: BC Managed Care – PPO

## 2014-05-31 ENCOUNTER — Encounter (HOSPITAL_COMMUNITY): Payer: Self-pay | Admitting: Emergency Medicine

## 2014-05-31 DIAGNOSIS — Z79899 Other long term (current) drug therapy: Secondary | ICD-10-CM | POA: Insufficient documentation

## 2014-05-31 DIAGNOSIS — K209 Esophagitis, unspecified without bleeding: Secondary | ICD-10-CM | POA: Diagnosis not present

## 2014-05-31 DIAGNOSIS — Z9089 Acquired absence of other organs: Secondary | ICD-10-CM | POA: Insufficient documentation

## 2014-05-31 DIAGNOSIS — J029 Acute pharyngitis, unspecified: Secondary | ICD-10-CM | POA: Insufficient documentation

## 2014-05-31 MED ORDER — KETOROLAC TROMETHAMINE 60 MG/2ML IM SOLN
60.0000 mg | Freq: Once | INTRAMUSCULAR | Status: AC
  Administered 2014-05-31: 60 mg via INTRAMUSCULAR
  Filled 2014-05-31: qty 2

## 2014-05-31 MED ORDER — HYDROCODONE-ACETAMINOPHEN 7.5-325 MG/15ML PO SOLN: 10.0000 mL | Freq: Four times a day (QID) | ORAL | Status: DC | PRN

## 2014-05-31 MED ORDER — GI COCKTAIL ~~LOC~~
30.0000 mL | Freq: Once | ORAL | Status: AC
  Administered 2014-05-31: 30 mL via ORAL
  Filled 2014-05-31: qty 30

## 2014-05-31 MED ORDER — HYDROCODONE-ACETAMINOPHEN 5-325 MG PO TABS
2.0000 | ORAL_TABLET | Freq: Once | ORAL | Status: DC
  Filled 2014-05-31: qty 2

## 2014-05-31 NOTE — ED Provider Notes (Signed)
CSN: [id number]     Arrival date & time 05/31/14  1551 History   First MD Initiated Contact with Patient 05/31/14 1726     Chief Complaint  Patient presents with  . Sore Throat  . Allergic Reaction     (Consider location/radiation/quality/duration/timing/severity/associated sxs/prior Treatment) HPI 23 year old female with past surgical history of bilateral tonsillectomy with multiple recent visits in urgent care in her PCP for her gastritis, nausea, vomiting presenting with 4-5 days of sore throat, dysphagia, feeling of a "lump in her throat". Patient states her discomfort was gradual in onset began on Tuesday or Wednesday of last week, has an aggravating factor of swallowing and has no alleviating factors. Patient reporting some mild abdominal pain, nausea, vomiting however states this has been present for the past several weeks, and she's been seen and evaluated multiple times for it including a visit to the ER on 05/27/14 which included a workup for abdominal pain with CT abdomen pelvis which was unremarkable. Patient was remarkable for a UTI was treated with Keflex at that time. She states she was recently placed on Diflucan for possible thrush infection, status post antibiotic and has taken that for approximately one day. Patient states her pain has not been alleviated and she is requesting an evaluation of just her throat today since she states it has become too painful to eat.  Past Medical History  Diagnosis Date  . Heart palpitations    Past Surgical History  Procedure Laterality Date  . Tonsillectomy    . Tonsillectomy     Family History  Problem Relation Age of Onset  . Vitamin D deficiency Mother   . Non-Hodgkin's lymphoma Other   . Depression Paternal Aunt   . Diabetes Maternal Grandmother   . Diabetes Maternal Grandfather   . Cancer Paternal Grandmother   . Cancer Paternal Grandfather   . Heart attack Paternal Grandfather    History  Substance Use Topics  . Smoking  status: Never Smoker   . Smokeless tobacco: Not on file  . Alcohol Use: Yes     Comment: occasionally   OB History   Grav Para Term Preterm Abortions TAB SAB Ect Mult Living                 Review of Systems  Constitutional: Negative for fever.  HENT: Positive for trouble swallowing. Negative for drooling, facial swelling and voice change.   Eyes: Negative for visual disturbance.  Respiratory: Negative for shortness of breath.   Cardiovascular: Negative for chest pain.  Gastrointestinal: Negative for nausea, vomiting and abdominal pain.  Genitourinary: Negative for dysuria.  Musculoskeletal: Negative for neck pain.  Skin: Negative for rash.  Neurological: Negative for dizziness, weakness and numbness.  Psychiatric/Behavioral: Negative.       Allergies  Review of patient's allergies indicates no known allergies.  Home Medications   Prior to Admission medications   Medication Sig Start Date End Date Taking? Authorizing Provider  fluconazole (DIFLUCAN) 100 MG tablet Take 100 mg by mouth daily.  05/31/14 06/07/14 Yes Historical Provider, MD  HYDROcodone-acetaminophen (NORCO/VICODIN) 5-325 MG per tablet Take 0.5-1 tablets by mouth every 6 (six) hours as needed for moderate pain.  05/27/14 06/01/14 Yes Historical Provider, MD  omeprazole (PRILOSEC) 40 MG capsule Take 1 capsule (40 mg total) by mouth 2 (two) times daily. 05/28/14  Yes Jade L Breeback, PA-C  promethazine (PHENERGAN) 25 MG tablet Take 25 mg by mouth every 6 (six) hours as needed for nausea or vomiting.   Yes  Historical Provider, MD  HYDROcodone-acetaminophen (HYCET) 7.5-325 mg/15 ml solution Take 10 mLs by mouth every 6 (six) hours as needed. 05/31/14   Monte Fantasia, PA-C   BP 96/52  Pulse 70  Temp(Src) 98.6 F (37 C) (Oral)  Resp 17  SpO2 96%  LMP 04/30/2014 Physical Exam  Nursing note and vitals reviewed. Constitutional: She is oriented to person, place, and time. She appears well-developed and well-nourished. No  distress.  HENT:  Head: Normocephalic and atraumatic. Head is without right periorbital erythema and without left periorbital erythema.  Mouth/Throat: Uvula is midline and oropharynx is clear and moist. No oral lesions. No trismus in the jaw. Normal dentition. No dental abscesses, uvula swelling, lacerations or dental caries. No oropharyngeal exudate, posterior oropharyngeal edema, posterior oropharyngeal erythema or tonsillar abscesses.  Eyes: Right eye exhibits no discharge. Left eye exhibits no discharge. No scleral icterus.  Neck: Trachea normal, normal range of motion and full passive range of motion without pain. No tracheal tenderness, no spinous process tenderness and no muscular tenderness present. No tracheal deviation, no edema, no erythema and normal range of motion present. No mass and no thyromegaly present.  Cardiovascular: Normal rate, regular rhythm and normal heart sounds.   No murmur heard. Pulmonary/Chest: Effort normal and breath sounds normal. No stridor. No respiratory distress.  Abdominal: Soft. There is no tenderness.  Musculoskeletal: Normal range of motion. She exhibits no edema and no tenderness.  Neurological: She is alert and oriented to person, place, and time. No cranial nerve deficit. Coordination normal.  Skin: Skin is warm and dry. No rash noted. She is not diaphoretic.  Psychiatric: She has a normal mood and affect.    ED Course  Procedures (including critical care time) Labs Review Labs Reviewed - No data to display  Imaging Review Dg Neck Soft Tissue  05/31/2014   CLINICAL DATA:  Right anterior lower neck pain with swallowing.  EXAM: NECK SOFT TISSUES - 1+ VIEW  COMPARISON:  CT 02/03/2008.  FINDINGS: There is no evidence of retropharyngeal soft tissue swelling or epiglottic enlargement. The cervical airway is unremarkable and no radio-opaque foreign body identified.  Loss of normal cervical lordosis.  No acute bony abnormality.  IMPRESSION: No acute  findings.   Electronically Signed   By: Charlett Nose M.D.   On: 05/31/2014 19:44     EKG Interpretation None      MDM   Final diagnoses:  Esophagitis, acute    23 year old female with past surgical history of bilateral tonsillectomy with multiple recent visits in urgent care in her PCP for her gastritis, nausea, vomiting presenting with 4-5 days of sore throat, dysphagia, feeling of a "lump in her throat". Patient nontoxic, in no acute distress sitting upright on the bed speaking in full, clear sentences. No trismus, drooling, tonsillar abscess, uvula deviation, cellulitis in throat concerning for PTA, retropharyngeal abscess, Ludwig angina. Patient offered by mouth pain medication, however states that it is painful to swallow and she would prefer a shot. We will attempt a Toradol shot to help patient's pain. She states she has taken morphine in the past and she did not like the way made her feel. We will obtain x-ray of soft tissue neck to help rule out retropharyngeal abscess, foreign body.   7:50 PM: Radiographs of soft tissue neck return with impression of no acute findings. No evidence of retropharyngeal soft tissue swelling or epiglottic enlargement. Cervical airway is unremarkable with no radiopaque foreign body identified. Patient continues to appear nontoxic and  in no acute distress. We spoke with Dr. Christella Hartigan with gastroenterology who agreed with our plan to have patient follow up with GI outpatient on Monday. We encouraged patient to call or return to the ER should she have any questions or concerns   Filed Vitals:   05/31/14 2037  BP: 96/52  Pulse: 70  Temp: 98.6 F (37 C)  Resp: 17    Signed,  Ladona Mow, PA-C 1:55 AM  This patient seen and discussed with Dr. Cathren Laine, MD   Monte Fantasia, PA-C 06/01/14 [phone]

## 2014-05-31 NOTE — ED Notes (Signed)
Pt reports being on multiple meds for n/v, abd pain last few days, keflex, hydrocodone, phenergan, prilosec. Primary MD felt she had thrush in her throat (esophagus/throat pain started a few days ago), increasing restlessness, shob. Rx'd diflucan this am, has taken one dose of that. Feels as though something is stuck in her throat. Difficulty swallowing anything that isn't soft or liquid.

## 2014-05-31 NOTE — ED Notes (Signed)
Lung sounds clear, even and unlabored. Patient reports tenderness when touching right side of neck and in epigastric area. She feels as if her food gets stuck when she swallows and she chokes. Her voice sounds some what hoarse.

## 2014-05-31 NOTE — ED Notes (Signed)
PA at bedside exploring options with patient.

## 2014-05-31 NOTE — Discharge Instructions (Signed)
Use hydrocodone syrup 10 mL as needed every 4-6 hours for pain. Continue taking omeprazole for acid reflux. Followup with gastroenterologist on Monday.   Esophagitis Esophagitis is inflammation of the esophagus. It can involve swelling, soreness, and pain in the esophagus. This condition can make it difficult and painful to swallow. CAUSES  Most causes of esophagitis are not serious. Many different factors can cause esophagitis, including:  Gastroesophageal reflux disease (GERD). This is when acid from your stomach flows up into the esophagus.  Recurrent vomiting.  An allergic-type reaction.  Certain medicines, especially those that come in large pills.  Ingestion of harmful chemicals, such as household cleaning products.  Heavy alcohol use.  An infection of the esophagus.  Radiation treatment for cancer.  Certain diseases such as sarcoidosis, Crohn's disease, and scleroderma. These diseases may cause recurrent esophagitis. SYMPTOMS   Trouble swallowing.  Painful swallowing.  Chest pain.  Difficulty breathing.  Nausea.  Vomiting.  Abdominal pain. DIAGNOSIS  Your caregiver will take your history and do a physical exam. Depending upon what your caregiver finds, certain tests may also be done, including:  Barium X-ray. You will drink a solution that coats the esophagus, and X-rays will be taken.  Endoscopy. A lighted tube is put down the esophagus so your caregiver can examine the area.  Allergy tests. These can sometimes be arranged through follow-up visits. TREATMENT  Treatment will depend on the cause of your esophagitis. In some cases, steroids or other medicines may be given to help relieve your symptoms or to treat the underlying cause of your condition. Medicines that may be recommended include:  Viscous lidocaine, to soothe the esophagus.  Antacids.  Acid reducers.  Proton pump inhibitors.  Antiviral medicines for certain viral infections of the  esophagus.  Antifungal medicines for certain fungal infections of the esophagus.  Antibiotic medicines, depending on the cause of the esophagitis. HOME CARE INSTRUCTIONS   Avoid foods and drinks that seem to make your symptoms worse.  Eat small, frequent meals instead of large meals.  Avoid eating for the 3 hours prior to your bedtime.  If you have trouble taking pills, use a pill splitter to decrease the size and likelihood of the pill getting stuck or injuring the esophagus on the way down. Drinking water after taking a pill also helps.  Stop smoking if you smoke.  Maintain a healthy weight.  Wear loose-fitting clothing. Do not wear anything tight around your waist that causes pressure on your stomach.  Raise the head of your bed 6 to 8 inches with wood blocks to help you sleep. Extra pillows will not help.  Only take over-the-counter or prescription medicines as directed by your caregiver. SEEK IMMEDIATE MEDICAL CARE IF:  You have severe chest pain that radiates into your arm, neck, or jaw.  You feel sweaty, dizzy, or lightheaded.  You have shortness of breath.  You vomit blood.  You have difficulty or pain with swallowing.  You have bloody or black, tarry stools.  You have a fever.  You have a burning sensation in the chest more than 3 times a week for more than 2 weeks.  You cannot swallow, drink, or eat.  You drool because you cannot swallow your saliva. MAKE SURE YOU:  Understand these instructions.  Will watch your condition.  Will get help right away if you are not doing well or get worse. Document Released: 10/27/2004 Document Revised: 12/12/2011 Document Reviewed: 05/20/2011 [facility] Patient Information 2015 [place], [place]. This information is not  intended to replace advice given to you by your health care provider. Make sure you discuss any questions you have with your health care provider. ° °

## 2014-05-31 NOTE — Telephone Encounter (Signed)
Called by ER at [facility].  She's had about a week of odynophagia/globus sensation.  Can eat solids and liquids but feels pain or lump sensation with swallowing.  Abx a few days ago but that was after odynophagia/globus started.  WAs also started on PPI.  Patty, Can you contact her Monday morning.  If still having symptoms, needs ngi with myself, extender or any MD spot in next few days.  Thanks

## 2014-05-31 NOTE — ED Notes (Signed)
PA at bedside.

## 2014-05-31 NOTE — ED Notes (Signed)
Patient transported to X-ray 

## 2014-06-02 NOTE — Telephone Encounter (Signed)
Mailbox is full and not accepting any messages.

## 2014-06-02 NOTE — ED Provider Notes (Signed)
Medical screening examination/treatment/procedure(s) were conducted as a shared visit with non-physician practitioner(s) and myself.  I personally evaluated the patient during the encounter.  Pt c/o feeling of lump in throat, soreness w swallowing for the past 1-2 weeks. Similar symptoms in past. Denies specific fb or food bolus getting stuck w swallowing. Is able to swallow without difficulty. No sob or stridor. On exam, no neck swelling or mass. No thyromegaly. No stridor or increased wob. Is swallowing/handling secretions without trouble. Discussed w gi, Dr Christella Hartigan, who indicates they will contact pt Monday and f/u closely.    Suzi Roots, MD 06/02/14 [phone]

## 2014-06-02 NOTE — Telephone Encounter (Signed)
The pt has been scheduled for Friday with Jessica at 2 pm for dysphagia

## 2014-06-06 ENCOUNTER — Encounter: Payer: Self-pay | Admitting: Gastroenterology

## 2014-06-06 ENCOUNTER — Ambulatory Visit (INDEPENDENT_AMBULATORY_CARE_PROVIDER_SITE_OTHER): Payer: BC Managed Care – PPO | Admitting: Gastroenterology

## 2014-06-06 VITALS — BP 92/58 | HR 72 | Ht 66.25 in | Wt 117.4 lb

## 2014-06-06 DIAGNOSIS — R1013 Epigastric pain: Secondary | ICD-10-CM

## 2014-06-06 DIAGNOSIS — K219 Gastro-esophageal reflux disease without esophagitis: Secondary | ICD-10-CM

## 2014-06-06 DIAGNOSIS — R1319 Other dysphagia: Secondary | ICD-10-CM | POA: Insufficient documentation

## 2014-06-06 DIAGNOSIS — R079 Chest pain, unspecified: Secondary | ICD-10-CM | POA: Insufficient documentation

## 2014-06-06 MED ORDER — SUCRALFATE 1 GM/10ML PO SUSP: 1.0000 g | Freq: Four times a day (QID) | ORAL | Status: DC

## 2014-06-06 NOTE — Patient Instructions (Signed)
You have been scheduled for an endoscopy. Please follow written instructions given to you at your visit today. If you use inhalers (even only as needed), please bring them with you on the day of your procedure. Your physician has requested that you go to www.startemmi.com and enter the access code given to you at your visit today. This web site gives a general overview about your procedure. However, you should still follow specific instructions given to you by our office regarding your preparation for the procedure.  We have sent the following medications to your pharmacy for you to pick up at your convenience: Carafate  Continue your Omeprazole _____________________________________________________________________________________________________________________________________  Food Choices for Gastroesophageal Reflux Disease When you have gastroesophageal reflux disease (GERD), the foods you eat and your eating habits are very important. Choosing the right foods can help ease the discomfort of GERD. WHAT GENERAL GUIDELINES DO I NEED TO FOLLOW?  Choose fruits, vegetables, whole grains, low-fat dairy products, and low-fat meat, fish, and poultry.  Limit fats such as oils, salad dressings, butter, nuts, and avocado.  Keep a food diary to identify foods that cause symptoms.  Avoid foods that cause reflux. These may be different for different people.  Eat frequent small meals instead of three large meals each day.  Eat your meals slowly, in a relaxed setting.  Limit fried foods.  Cook foods using methods other than frying.  Avoid drinking alcohol.  Avoid drinking large amounts of liquids with your meals.  Avoid bending over or lying down until 2-3 hours after eating. WHAT FOODS ARE NOT RECOMMENDED? The following are some foods and drinks that may worsen your symptoms: Vegetables Tomatoes. Tomato juice. Tomato and spaghetti sauce. Chili peppers. Onion and garlic.  Horseradish. Fruits Oranges, grapefruit, and lemon (fruit and juice). Meats High-fat meats, fish, and poultry. This includes hot dogs, ribs, ham, sausage, salami, and bacon. Dairy Whole milk and chocolate milk. Sour cream. Cream. Butter. Ice cream. Cream cheese.  Beverages Coffee and tea, with or without caffeine. Carbonated beverages or energy drinks. Condiments Hot sauce. Barbecue sauce.  Sweets/Desserts Chocolate and cocoa. Donuts. Peppermint and spearmint. Fats and Oils High-fat foods, including [place] fries and potato chips. Other Vinegar. Strong spices, such as black pepper, white pepper, red pepper, cayenne, curry powder, cloves, ginger, and chili powder. The items listed above may not be a complete list of foods and beverages to avoid. Contact your dietitian for more information. Document Released: 09/19/2005 Document Revised: 09/24/2013 Document Reviewed: 07/24/2013 [facility] Patient Information 2015 [place], [place]. This information is not intended to replace advice given to you by your health care provider. Make sure you discuss any questions you have with your health care provider.

## 2014-06-06 NOTE — Progress Notes (Signed)
06/06/2014 Jennifer Bell [id number] [birth year]   HISTORY OF PRESENT ILLNESS:  This is a pleasant 23 year old who presents to our office today with complaints of burning in her chest and epigastric abdominal pain along with difficulty in swallowing and nausea. She states that 2 months ago she woke up from sleep with an episode of severe acid reflux. That subsided after some time, but then approximately 2 weeks ago she developed a burning sensation in her chest and pain in her epigastrium. She has had a lot of nausea and has not been able to eat due to the discomfort. She says that there is constant burning in her throat and in her chest.  Food has not actually gotten stuck but she feels as if there is something always constantly in her esophagus (globus sensation).  Last week she vomited 4 days in a row. She has been taking TUMS and Rolaids without any improvement and was placed on omeprazole 40 mg 2 times daily, which she has been taking for the past week and a half or so without much improvement in her symptoms. CBC and H. pylori IgG antibody are negative/normal.  She was treated empirically for thrush with a short course of Diflucan. She's apparently been to see her PCP as well as to the ER and to urgent care for her symptoms.    Past Medical History  Diagnosis Date  . Heart palpitations   . GERD (gastroesophageal reflux disease)    Past Surgical History  Procedure Laterality Date  . Tonsillectomy    . Wisdom tooth extraction      reports that she has never smoked. She has never used smokeless tobacco. She reports that she drinks alcohol. She reports that she does not use illicit drugs. family history includes Depression in her paternal aunt; Diabetes in her maternal grandfather and maternal grandmother; Lung cancer in her maternal grandfather; Non-Hodgkin's lymphoma in her paternal grandfather; Vitamin D deficiency in her mother. No Known Allergies    Outpatient Encounter  Prescriptions as of 06/06/2014  Medication Sig  . HYDROcodone-acetaminophen (HYCET) 7.5-325 mg/15 ml solution Take 10 mLs by mouth every 6 (six) hours as needed.  Marland Kitchen omeprazole (PRILOSEC) 40 MG capsule Take 1 capsule (40 mg total) by mouth 2 (two) times daily.  . promethazine (PHENERGAN) 25 MG tablet Take 25 mg by mouth every 6 (six) hours as needed for nausea or vomiting.  . sucralfate (CARAFATE) 1 GM/10ML suspension Take 10 mLs (1 g total) by mouth 4 (four) times daily.  . [DISCONTINUED] fluconazole (DIFLUCAN) 100 MG tablet Take 100 mg by mouth daily.      REVIEW OF SYSTEMS  : All other systems reviewed and negative except where noted in the History of Present Illness.   PHYSICAL EXAM: BP 92/58  Pulse 72  Ht 5' 6.25" (1.683 m)  Wt 117 lb 6 oz (53.241 kg)  BMI 18.80 kg/m2  LMP 05/06/2014 General: Well developed white female in no acute distress Head: Normocephalic and atraumatic Eyes:  Sclerae anicteric, conjunctiva pink. Ears: Normal auditory acuity  Lungs: Clear throughout to auscultation Heart: Regular rate and rhythm Abdomen: Soft, non-distended.  Normal bowel sounds.  Mild to moderate epigastric TTP. Musculoskeletal: Symmetrical with no gross deformities  Skin: No lesions on visible extremities Extremities: No edema  Neurological: Alert oriented x 4, grossly non-focal Psychological:  Alert and cooperative. Normal mood and affect  ASSESSMENT AND PLAN: -GERD with dysphagia, epigastric abdominal pain, and chest pain:  No improvement on twice  daily PPI therapy. She will continue the omeprazole 40 mg twice daily, but we will also had Carafate suspension 4 times per day. We will schedule for EGD to rule out esophagitis, ulcer disease, etc.

## 2014-06-07 NOTE — Progress Notes (Signed)
Reviewed and agree that the next step is EGD

## 2014-06-11 ENCOUNTER — Encounter: Payer: Self-pay | Admitting: *Deleted

## 2014-06-11 ENCOUNTER — Ambulatory Visit (AMBULATORY_SURGERY_CENTER): Payer: BC Managed Care – PPO | Admitting: Internal Medicine

## 2014-06-11 ENCOUNTER — Encounter: Payer: Self-pay | Admitting: Internal Medicine

## 2014-06-11 ENCOUNTER — Telehealth: Payer: Self-pay | Admitting: *Deleted

## 2014-06-11 ENCOUNTER — Other Ambulatory Visit: Payer: Self-pay | Admitting: *Deleted

## 2014-06-11 VITALS — BP 102/69 | HR 63 | Temp 98.8°F | Resp 16 | Ht 66.0 in | Wt 117.0 lb

## 2014-06-11 DIAGNOSIS — R079 Chest pain, unspecified: Secondary | ICD-10-CM

## 2014-06-11 DIAGNOSIS — R1319 Other dysphagia: Secondary | ICD-10-CM

## 2014-06-11 DIAGNOSIS — R0989 Other specified symptoms and signs involving the circulatory and respiratory systems: Secondary | ICD-10-CM

## 2014-06-11 DIAGNOSIS — K219 Gastro-esophageal reflux disease without esophagitis: Secondary | ICD-10-CM

## 2014-06-11 DIAGNOSIS — K2270 Barrett's esophagus without dysplasia: Secondary | ICD-10-CM

## 2014-06-11 MED ORDER — LORAZEPAM 0.5 MG PO TABS: 0.5000 mg | ORAL_TABLET | Freq: Two times a day (BID) | ORAL | Status: DC

## 2014-06-11 MED ORDER — SODIUM CHLORIDE 0.9 % IV SOLN: 500.0000 mL | INTRAVENOUS | Status: DC

## 2014-06-11 NOTE — Telephone Encounter (Signed)
Scheduled with Alishia at [facility] radiology on 06/17/14 at 9:30 AM. NPO 3 hours prior. Spoke with Toniann Fail, RN in [facility] recovery and gave her appointment date, time and instructions for patient.

## 2014-06-11 NOTE — Patient Instructions (Addendum)

## 2014-06-11 NOTE — Progress Notes (Signed)
A/ox3, pleased with MAC, report to RN 

## 2014-06-11 NOTE — Op Note (Signed)
[facility] [address] [place], [address]   ENDOSCOPY PROCEDURE REPORT  PATIENT: Jennifer Bell, Jennifer Bell  MR#: [id number] BIRTHDATE: 10-14-1990 , 23  yrs. old GENDER: Female ENDOSCOPIST: Hart Carwin, MD REFERRED BY:  Dr Rodney Langton PROCEDURE DATE:  06/11/2014 PROCEDURE:  EGD w/ biopsy ASA CLASS:     Class I INDICATIONS:  Chest pain.   Heartburn.   Nausea.   globus sensation, not responsive to high-dose PPI and Carafate. MEDICATIONS: MAC sedation, administered by CRNA and propofol (Diprivan)  IV TOPICAL ANESTHETIC: none  DESCRIPTION OF PROCEDURE: After the risks benefits and alternatives of the procedure were thoroughly explained, informed consent was obtained.  The LB [id number] endoscope was introduced through the mouth and advanced to the second portion of the duodenum. Without limitations.  The instrument was slowly withdrawn as the mucosa was fully examined.      Esophagus: upper esophageal sphincter, proximal and mid esophageal mucosa appeared normal. There was a distinct squamocolumnar junction with mild fibrosis but no stricture. It appeared irregular without erosions. Biopsies were taken to assess for chronic esophagitis. Stomach: there was set to 23 cm partially reducible hiatal hernia spanning from 32-35 cm from the incisors. There were no Cameron erosions. Gastric folds were normal. Gastric antrum pyloric outlet were unremarkable. Retroflexion of the endoscope confirmed presence of hiatal hernia Duodenum: duodenal bulb and descending duodenum was normal[ The scope was then withdrawn from the patient and the procedure completed.  COMPLICATIONS: There were no complications. ENDOSCOPIC IMPRESSION:  1. 2-3 cm partially reducible hiatal hernia with no evidence of esophagitis or Cameron erosions 2. bile reflux 3. No evidence of esophageal stricture. Normal squamocolumnar junction. Status post  biopsies  RECOMMENDATIONS: 1.  Await pathology results 2.  Anti-reflux regimen to be follow 3.  Continue PPI . Barium esophagram to assess motility and gastroesophageal reflux 5. consider adding Reglan 5 mg before meals and 10 mg at bedtime, depending on the results of the barium esophagram 6. if no improvement on current regimen Will assess gastric emptying scan to rule out gastroparesis 7. consider Ativan 0.5 mg twice a day when necessary stress which may be a contributing factor to patient's symptoms 8. Followup office visit in 6 weeks  REPEAT EXAM: for No recall due to age.Marland Kitchen  eSigned:  Hart Carwin, MD 06/11/2014 1:55 PM   CC:[Carbon Copy]  PATIENT NAME:  Jennifer Bell, Jennifer Bell MR#: [id number]

## 2014-06-11 NOTE — Progress Notes (Signed)
Called to room to assist during endoscopic procedure.  Patient ID and intended procedure confirmed with present staff. Received instructions for my participation in the procedure from the performing physician.  

## 2014-06-12 ENCOUNTER — Telehealth: Payer: Self-pay

## 2014-06-12 NOTE — Telephone Encounter (Signed)
Unable to leave message mailbox is full.

## 2014-06-17 ENCOUNTER — Encounter: Payer: Self-pay | Admitting: Internal Medicine

## 2014-06-17 ENCOUNTER — Ambulatory Visit (HOSPITAL_COMMUNITY)
Admission: RE | Admit: 2014-06-17 | Discharge: 2014-06-17 | Disposition: A | Discharge status: Home / Self Care | Payer: BC Managed Care – PPO | Source: Ambulatory Visit | Attending: Internal Medicine | Admitting: Internal Medicine

## 2014-06-17 DIAGNOSIS — F458 Other somatoform disorders: Secondary | ICD-10-CM | POA: Insufficient documentation

## 2014-06-17 DIAGNOSIS — K449 Diaphragmatic hernia without obstruction or gangrene: Secondary | ICD-10-CM | POA: Diagnosis not present

## 2014-06-17 DIAGNOSIS — K219 Gastro-esophageal reflux disease without esophagitis: Secondary | ICD-10-CM | POA: Diagnosis not present

## 2014-06-17 DIAGNOSIS — R0989 Other specified symptoms and signs involving the circulatory and respiratory systems: Secondary | ICD-10-CM

## 2014-06-18 ENCOUNTER — Encounter: Payer: Self-pay | Admitting: Sports Medicine

## 2014-06-18 ENCOUNTER — Encounter: Payer: Self-pay | Admitting: *Deleted

## 2014-06-19 ENCOUNTER — Encounter: Payer: Self-pay | Admitting: *Deleted

## 2014-07-29 ENCOUNTER — Ambulatory Visit (INDEPENDENT_AMBULATORY_CARE_PROVIDER_SITE_OTHER): Payer: BC Managed Care – PPO | Admitting: Internal Medicine

## 2014-07-29 ENCOUNTER — Encounter: Payer: Self-pay | Admitting: Internal Medicine

## 2014-07-29 VITALS — BP 90/68 | HR 68 | Ht 66.0 in | Wt 122.0 lb

## 2014-07-29 DIAGNOSIS — K219 Gastro-esophageal reflux disease without esophagitis: Secondary | ICD-10-CM

## 2014-07-29 DIAGNOSIS — K2270 Barrett's esophagus without dysplasia: Secondary | ICD-10-CM

## 2014-07-29 MED ORDER — PANTOPRAZOLE SODIUM 40 MG PO TBEC: 40.0000 mg | DELAYED_RELEASE_TABLET | Freq: Two times a day (BID) | ORAL | Status: DC

## 2014-07-29 MED ORDER — LORAZEPAM 0.5 MG PO TABS: 0.5000 mg | ORAL_TABLET | Freq: Two times a day (BID) | ORAL | Status: DC

## 2014-07-29 NOTE — Patient Instructions (Addendum)
Please purchase the following medications over the counter and take as directed: Gaviscon- Chew 2 tablets after meals and as needed  Please discontinue omeprazole.  We have sent the following medications to your pharmacy for you to pick up at your convenience: pantoprazole Ativan  Dr Williams Chehomas Thekkekandan

## 2014-07-29 NOTE — Progress Notes (Signed)
Crist FatBrittni N Kirkman 1991-01-14 [id number]  Note: This dictation was prepared with Dragon digital system. Any transcriptional errors that result from this procedure are unintentional.   History of Present Illness:  This is a 23 year old white female with gastroesophageal reflux. A recent upper endoscopy on 06/11/2014 showed intestinal metaplasia consistent with Barrett's esophagus. Her H. pylori test was negative. She has a 3 cm hiatal hernia. She has been on omeprazole 40 mg twice a day and Carafate slurry with only modest improvement. Her barium esophagram on 06/17/2014 showed normal motility, no reflux and a small reducible hiatal hernia as well as transient delay of the 13 mm barium tablet at the aortic arch. She has been on Ativan 0.5 mg twice a day with some relief. She has been under a lot of stress going to school, doing an internship in teaching and working full-time. Her eating habits have been irregular due to her busy schedule.    Past Medical History  Diagnosis Date  . Heart palpitations   . GERD (gastroesophageal reflux disease)   . Barrett esophagus   . Hiatal hernia     Past Surgical History  Procedure Laterality Date  . Tonsillectomy    . Wisdom tooth extraction    . Myringotomy with tube placement      No Known Allergies  Family history and social history have been reviewed.  Review of Systems: Denies dysphagia. Positive to odynophagia, no cough or hoarseness  The remainder of the 10 point ROS is negative except as outlined in the H&P  Physical Exam: General Appearance Well developed, in no distress Psychological Normal mood and affect  Assessment and Plan:   Problem #201 [age] year old white female with a new diagnosis of Barrett's esophagus and gastroesophageal reflux although no reflux was observed on a barium esophagram. She is requesting stronger acid suppressing medication. We will try her on Protonix 40 mg twice a day. Instead of Carafate slurry which is quite  expensive, she will be using Gaviscon 2 tablets after meals and when necessary. We have discussed modifying  eating habits. She is taking Ativan when necessary for relaxation. I have recommended Paxil 10 mg but she is reluctant to take it. She will let us know in the next 4-6 weeks how she is doing. Recall upper endoscopy in 2 years    Lina SarDora Ellionna Buckbee 07/29/2014

## 2014-10-18 ENCOUNTER — Emergency Department
Admission: EM | Admit: 2014-10-18 | Discharge: 2014-10-18 | Disposition: A | Discharge status: Home / Self Care | Payer: BLUE CROSS/BLUE SHIELD | Source: Home / Self Care | Attending: Family Medicine | Admitting: Family Medicine

## 2014-10-18 ENCOUNTER — Encounter: Payer: Self-pay | Admitting: Emergency Medicine

## 2014-10-18 DIAGNOSIS — J069 Acute upper respiratory infection, unspecified: Secondary | ICD-10-CM

## 2014-10-18 DIAGNOSIS — B9789 Other viral agents as the cause of diseases classified elsewhere: Principal | ICD-10-CM

## 2014-10-18 HISTORY — DX: Anxiety disorder, unspecified: F41.9

## 2014-10-18 LAB — POCT URINE PREGNANCY: Preg Test, Ur: NEGATIVE

## 2014-10-18 LAB — POCT RAPID STREP A (OFFICE): Rapid Strep A Screen: NEGATIVE

## 2014-10-18 MED ORDER — BENZONATATE 200 MG PO CAPS: 200.0000 mg | ORAL_CAPSULE | Freq: Every day | ORAL | Status: DC

## 2014-10-18 MED ORDER — AZITHROMYCIN 250 MG PO TABS
ORAL_TABLET | ORAL | Status: DC
Start: 2014-10-18 — End: 2014-12-11

## 2014-10-18 NOTE — ED Notes (Signed)
Pt c/o sore throat, congestion and cough x1 week. Also c/o HA ear pain, dizziness hoarseness, and body aches. No OTC meds in past 4 hours.

## 2014-10-18 NOTE — ED Provider Notes (Signed)
CSN: [id number]     Arrival date & time 10/18/14  [id number] History   First MD Initiated Contact with Patient 10/18/14 1016     Chief Complaint  Patient presents with  . Sore Throat      HPI Comments: Patient complains of six day history of typical cold-like symptoms including mild sore throat, sinus congestion, headache, fatigue, and cough.  Last night she developed hoarseness and left ear fullness.  The history is provided by the patient.    Past Medical History  Diagnosis Date  . Heart palpitations   . GERD (gastroesophageal reflux disease)   . Barrett esophagus   . Hiatal hernia   . Anxiety    Past Surgical History  Procedure Laterality Date  . Tonsillectomy    . Wisdom tooth extraction    . Myringotomy with tube placement     Family History  Problem Relation Age of Onset  . Vitamin D deficiency Mother   . Non-Hodgkin's lymphoma Paternal Grandfather   . Depression Paternal Aunt   . Diabetes Maternal Grandmother   . Diabetes Maternal Grandfather   . Lung cancer Maternal Grandfather    History  Substance Use Topics  . Smoking status: Never Smoker   . Smokeless tobacco: Never Used  . Alcohol Use: Yes     Comment: occasionally   OB History    No data available     Review of Systems + sore throat + hoarse + dizzy + cough No pleuritic pain No wheezing + nasal congestion + post-nasal drainage No sinus pain/pressure No itchy/red eyes ? earache No hemoptysis No SOB No fever, + chills + nausea No vomiting + abdominal pain No diarrhea No urinary symptoms No skin rash + fatigue No myalgias + headache Used OTC meds without relief  Allergies  Review of patient's allergies indicates no known allergies.  Home Medications   Prior to Admission medications   Medication Sig Start Date End Date Taking? Authorizing Provider  azithromycin (ZITHROMAX Z-PAK) 250 MG tablet Take 2 tabs today; then begin one tab once daily for 4 more days. (Rx void after 10/26/14)  10/18/14   Lattie HawStephen A Orena Cavazos, MD  benzonatate (TESSALON) 200 MG capsule Take 1 capsule (200 mg total) by mouth at bedtime. Take as needed for cough 10/18/14   Lattie HawStephen A Doha Boling, MD  LORazepam (ATIVAN) 0.5 MG tablet Take 1 tablet (0.5 mg total) by mouth 2 (two) times daily. 07/29/14   Hart Carwinora M Brodie, MD  pantoprazole (PROTONIX) 40 MG tablet Take 1 tablet (40 mg total) by mouth 2 (two) times daily. 07/29/14   Hart Carwinora M Brodie, MD   BP 102/68 mmHg  Pulse 80  Temp(Src) 98.2 F (36.8 C) (Oral)  Resp 16  Ht 5\' 7"  (1.702 m)  Wt 117 lb (53.071 kg)  BMI 18.32 kg/m2  SpO2 99%  LMP 08/24/2014 (Exact Date) Physical Exam Nursing notes and Vital Signs reviewed. Appearance:  Patient appears stated age, and in no acute distress Eyes:  Pupils are equal, round, and reactive to light and accomodation.  Extraocular movement is intact.  Conjunctivae are not inflamed  Ears:  Canals normal.  Tympanic membranes normal.  Nose:  Mildly congested turbinates.  No sinus tenderness.   Pharynx:   Erythematous Neck:  Supple.  Slightly tender posterior nodes are palpated bilaterally  Lungs:  Clear to auscultation.  Breath sounds are equal.  Heart:  Regular rate and rhythm without murmurs, rubs, or gallops.  Abdomen:  Nontender without masses or hepatosplenomegaly.  Bowel  sounds are present.  No CVA or flank tenderness.  Extremities:  No edema.  No calf tenderness Skin:  No rash present.   ED Course  Procedures  None    Labs Reviewed  POCT RAPID STREP A (OFFICE):  negative         MDM   1. Viral URI with cough    There is no evidence of bacterial infection today.  Treat symptomatically for now  Prescription written for Benzonatate (Tessalon) to take at bedtime for night-time cough.  Take plain guaifenesin  (Mucinex) twice daily, with plenty of water, for cough and congestion. Get adequate rest.   May use Afrin nasal spray (or generic oxymetazoline) twice daily for about 5 days.  Also recommend using saline  nasal spray several times daily and saline nasal irrigation (AYR is a common brand) Try warm salt water gargles for sore throat.  Stop all antihistamines for now, and other non-prescription cough/cold preparations. May take Ibuprofen , 4 tabs every 8 hours with food for sore throat, headache, etc. Begin Azithromycin if not improving about one week or if persistent fever develops (Given a prescription to hold, with an expiration date)  Follow-up with family doctor if not improving about10 days.    Lattie Haw, MD 10/20/14 1025

## 2014-10-18 NOTE — Discharge Instructions (Signed)
Take plain guaifenesin 1200mg  (Mucinex) twice daily, with plenty of water, for cough and congestion. Get adequate rest.   May use Afrin nasal spray (or generic oxymetazoline) twice daily for about 5 days.  Also recommend using saline nasal spray several times daily and saline nasal irrigation (AYR is a common brand) Try warm salt water gargles for sore throat.  Stop all antihistamines for now, and other non-prescription cough/cold preparations. May take Ibuprofen 200mg , 4 tabs every 8 hours with food for sore throat, headache, etc. Begin Azithromycin if not improving about one week or if persistent fever develops   Follow-up with family doctor if not improving about10 days.

## 2014-11-22 LAB — OB RESULTS CONSOLE GBS: GBS: NEGATIVE

## 2014-12-11 ENCOUNTER — Encounter: Payer: Self-pay | Admitting: Emergency Medicine

## 2014-12-11 ENCOUNTER — Emergency Department
Admission: EM | Admit: 2014-12-11 | Discharge: 2014-12-11 | Disposition: A | Discharge status: Home / Self Care | Payer: BLUE CROSS/BLUE SHIELD | Source: Home / Self Care | Attending: Emergency Medicine | Admitting: Emergency Medicine

## 2014-12-11 DIAGNOSIS — J029 Acute pharyngitis, unspecified: Secondary | ICD-10-CM | POA: Diagnosis not present

## 2014-12-11 DIAGNOSIS — J0101 Acute recurrent maxillary sinusitis: Secondary | ICD-10-CM

## 2014-12-11 LAB — POCT RAPID STREP A (OFFICE): Rapid Strep A Screen: NEGATIVE

## 2014-12-11 MED ORDER — CEFDINIR 300 MG PO CAPS: 300.0000 mg | ORAL_CAPSULE | Freq: Two times a day (BID) | ORAL | Status: DC

## 2014-12-11 MED ORDER — FLUTICASONE PROPIONATE 50 MCG/ACT NA SUSP: NASAL | Status: DC

## 2014-12-11 MED ORDER — BENZONATATE 200 MG PO CAPS: ORAL_CAPSULE | ORAL | Status: DC

## 2014-12-11 NOTE — ED Notes (Signed)
Sore throat, cough, congestion, stomach hurts, lungs hurt, nausea x 5 days, has been sick for about a month intermittently

## 2014-12-11 NOTE — ED Provider Notes (Signed)
CSN: [id number]     Arrival date & time 12/11/14  [id number] History   First MD Initiated Contact with Patient 12/11/14 [id number]     Chief Complaint  Patient presents with  . Sore Throat   (Consider location/radiation/quality/duration/timing/severity/associated sxs/prior Treatment) HPI URI HISTORY  Gennie is a 24 y.o. female who complains of onset of cold symptoms for several days, but she feels she's had URI symptoms for about a month intermittently, but just worse for the past 5 days.   Have been using over-the-counter treatment which helps a little bit. 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 Rarely has abdominal pain, not now. She feels this is likely muscular when she coughs. No vomiting No diarrhea Denies GYN symptoms. Denies chance of pregnancy. Last menstrual period normal 1 week ago.  No skin rashes +  Fatigue No myalgias No headache   Past Medical History  Diagnosis Date  . Heart palpitations   . GERD (gastroesophageal reflux disease)   . Barrett esophagus   . Hiatal hernia   . Anxiety    Past Surgical History  Procedure Laterality Date  . Tonsillectomy    . Wisdom tooth extraction    . Myringotomy with tube placement     Family History  Problem Relation Age of Onset  . Vitamin D deficiency Mother   . Non-Hodgkin's lymphoma Paternal Grandfather   . Depression Paternal Aunt   . Diabetes Maternal Grandmother   . Diabetes Maternal Grandfather   . Lung cancer Maternal Grandfather    History  Substance Use Topics  . Smoking status: Never Smoker   . Smokeless tobacco: Never Used  . Alcohol Use: Yes     Comment: occasionally   OB History    No  data available     Review of Systems  All other systems reviewed and are negative.   Allergies  Review of patient's allergies indicates not on file.  Home Medications   Prior to Admission medications   Medication Sig Start Date End Date Taking? Authorizing Provider  benzonatate (TESSALON) 200 MG capsule Take 1 every 8 hours as needed for cough. 12/11/14   Lajean Manes, MD  cefdinir (OMNICEF) 300 MG capsule Take 1 capsule (300 mg total) by mouth 2 (two) times daily. X 10 days 12/11/14   Lajean Manes, MD  fluticasone [facility]) 50 MCG/ACT nasal spray 1 or 2 sprays each nostril twice a day 12/11/14   Lajean Manes, MD  LORazepam (ATIVAN) 0.5 MG tablet Take 1 tablet (0.5 mg total) by mouth 2 (two) times daily. 07/29/14   Hart Carwin, MD  pantoprazole (PROTONIX) 40 MG tablet Take 1 tablet (40 mg total) by mouth 2 (two) times daily. 07/29/14   Hart Carwin, MD   BP 106/70 mmHg  Pulse 79  Temp(Src) 98.3 F (36.8 C) (Oral)  Ht  (1.702 m)  Wt 121 lb (54.885 kg)  BMI 18.95 kg/m2  SpO2 100%  LMP 12/05/2014 Physical Exam  Constitutional: She is oriented to person, place, and time. She appears well-developed and well-nourished. No distress.  HENT:  Head: Normocephalic and atraumatic.  Right Ear: Tympanic membrane, external ear and ear canal normal.  Left  Ear: Tympanic membrane, external ear and ear canal normal.  Nose: Mucosal edema and rhinorrhea present. Right sinus exhibits maxillary sinus tenderness. Left sinus exhibits maxillary sinus tenderness.  Mouth/Throat: Mucous membranes are normal. No oral lesions. Posterior oropharyngeal erythema (Mild) present. No oropharyngeal exudate.  Posterior pharynx: Mildly red. No exudate. Surgically absent tonsils.  Eyes: Right eye exhibits no discharge. Left eye exhibits no discharge. No scleral icterus.  Neck: Neck supple.  Supple. Tender shotty anterior cervical nodes. No posterior cervical adenopathy  Cardiovascular: Normal rate, regular  rhythm and normal heart sounds.   Pulmonary/Chest: Effort normal and breath sounds normal. She has no wheezes. She has no rales.  Lymphadenopathy:    She has no cervical adenopathy.  Neurological: She is alert and oriented to person, place, and time.  Skin: Skin is warm and dry.  Nursing note and vitals reviewed.   ED Course  Procedures (including critical care time) Labs Review Labs Reviewed  POCT RAPID STREP A (OFFICE)   Rapid strep test negative Imaging Review No results found.   MDM   1. Acute pharyngitis, unspecified pharyngitis type   2. Acute recurrent maxillary sinusitis    Treatment options discussed, as well as risks, benefits, alternatives. Patient voiced understanding and agreement with the following plans: Omnicef 300 twice a day 10 days Sudafed but other symptomatic care Flonase Tessalon Perles Follow-up with your primary care doctor in 5-7 days if not improving, or sooner if symptoms become worse. Precautions discussed. Red flags discussed. Questions invited and answered. Patient voiced understanding and agreement.      Lajean Manesavid Massey, MD 12/11/14 1038

## 2014-12-13 ENCOUNTER — Telehealth: Payer: Self-pay | Admitting: Emergency Medicine

## 2014-12-13 NOTE — ED Notes (Signed)
Patient states she is feeling better.

## 2014-12-16 ENCOUNTER — Ambulatory Visit (INDEPENDENT_AMBULATORY_CARE_PROVIDER_SITE_OTHER): Payer: BLUE CROSS/BLUE SHIELD | Admitting: Sports Medicine

## 2014-12-16 ENCOUNTER — Encounter: Payer: Self-pay | Admitting: Sports Medicine

## 2014-12-16 DIAGNOSIS — Z0000 Encounter for general adult medical examination without abnormal findings: Secondary | ICD-10-CM | POA: Diagnosis not present

## 2014-12-16 DIAGNOSIS — Z111 Encounter for screening for respiratory tuberculosis: Secondary | ICD-10-CM | POA: Diagnosis not present

## 2014-12-16 DIAGNOSIS — R591 Generalized enlarged lymph nodes: Secondary | ICD-10-CM | POA: Diagnosis not present

## 2014-12-16 DIAGNOSIS — R809 Proteinuria, unspecified: Secondary | ICD-10-CM

## 2014-12-16 MED ORDER — AZITHROMYCIN 250 MG PO TABS: ORAL_TABLET | ORAL | Status: DC

## 2014-12-16 NOTE — Assessment & Plan Note (Addendum)
Physical exam performed today. Up-to-date on all vaccinations, cervical cancer screening was normal last month with her OB/GYN. Checking routine blood work. The patient was given her school form back, I filled out everything but the PPD section which can be filled out when she returns to recheck. In the future we can obtain a Quantifer[employer] Gold.

## 2014-12-16 NOTE — Progress Notes (Signed)
  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  Incidentally noted a recent OB/GYN visit, where she had a normal Pap smear last month, was significant proteinuria. She was asked to follow up with me regarding this. She has no facial or leg swelling. No family history of nephrotic syndrome, she does have a family history of renal cell carcinoma in her grandmother. It doesn't sound as though there was any hematuria in her urine test. She's also had right-sided cervical lymphadenopathy for some time now, months. No fevers, chills, night sweats, weight loss.  Past medical history, Surgical history, Family history not pertinant except as noted below, Social history, Allergies, and medications have been entered into the medical record, reviewed, and no changes needed.   Review of Systems: No headache, visual changes, nausea, vomiting, diarrhea, constipation, dizziness, abdominal pain, skin rash, fevers, chills, night sweats, swollen lymph nodes, weight loss, chest pain, body aches, joint swelling, muscle aches, shortness of breath, mood changes, visual or auditory hallucinations.  Objective:    General: Well Developed, well nourished, and in no acute distress.  Neuro: Alert and oriented x3, extra-ocular muscles intact, sensation grossly intact. Cranial nerves II through XII are intact, motor, sensory, and coordinative functions are all intact. HEENT: Normocephalic, atraumatic, pupils equal round reactive to light, neck supple, no masses, shotty cervical lymphadenopathy bilaterally in the anterior chain, rubbery, approximately 1 cm in diameter nontender, thyroid nonpalpable. Oropharynx, nasopharynx, external ear canals are unremarkable. Skin: Warm and dry, no rashes noted.  Cardiac:  Regular rate and rhythm, no murmurs rubs or gallops.  Respiratory: Clear to auscultation bilaterally. Not using accessory muscles, speaking in full sentences.  Abdominal: Soft, nontender, nondistended, positive bowel sounds, no masses, no organomegaly.  Musculoskeletal: Shoulder, elbow, wrist, hip, knee, ankle stable, and with full range of motion.  Impression and Recommendations:    The patient was counselled, risk factors were discussed, anticipatory guidance given.

## 2014-12-16 NOTE — Assessment & Plan Note (Signed)
Bilateral shotty, rubbery. Azithromycin. Ultrasound if no better.

## 2014-12-16 NOTE — Assessment & Plan Note (Addendum)
Noted with OBGYN. Rechecking Urinalysis. If positive we will check a first morning urinalysis to evaluate for orthostatic proteinuria, if still positive we will need to do imaging. Renal ultrasound if still present.

## 2014-12-17 LAB — CBC
HCT: 38.3 % (ref 36.0–46.0)
Hemoglobin: 13.3 g/dL (ref 12.0–15.0)
MCH: 29.9 pg (ref 26.0–34.0)
MCHC: 34.7 g/dL (ref 30.0–36.0)
MCV: 86.1 fL (ref 78.0–100.0)
MPV: 9.8 fL (ref 8.6–12.4)
Platelets: 237 K/uL (ref 150–400)
RBC: 4.45 MIL/uL (ref 3.87–5.11)
RDW: 12.8 % (ref 11.5–15.5)
WBC: 3.6 K/uL — ABNORMAL LOW (ref 4.0–10.5)

## 2014-12-17 LAB — COMPREHENSIVE METABOLIC PANEL
ALT: 12 U/L (ref 0–35)
Calcium: 8.9 mg/dL (ref 8.4–10.5)
Creat: 0.62 mg/dL (ref 0.50–1.10)
Glucose, Bld: 75 mg/dL (ref 70–99)
Total Protein: 6.8 g/dL (ref 6.0–8.3)

## 2014-12-17 LAB — COMPREHENSIVE METABOLIC PANEL WITH GFR
AST: 20 U/L (ref 0–37)
Albumin: 4.2 g/dL (ref 3.5–5.2)
Alkaline Phosphatase: 55 U/L (ref 39–117)
BUN: 9 mg/dL (ref 6–23)
CO2: 28 meq/L (ref 19–32)
Chloride: 104 meq/L (ref 96–112)
Potassium: 4.2 meq/L (ref 3.5–5.3)
Sodium: 140 meq/L (ref 135–145)
Total Bilirubin: 0.4 mg/dL (ref 0.2–1.2)

## 2014-12-17 LAB — LIPID PANEL
Cholesterol: 133 mg/dL (ref 0–200)
HDL: 54 mg/dL (ref >=46)
LDL Cholesterol: 71 mg/dL (ref 0–99)
Total CHOL/HDL Ratio: 2.5 Ratio
Triglycerides: 40 mg/dL (ref <150)
VLDL: 8 mg/dL (ref 0–40)

## 2014-12-17 LAB — URINALYSIS
Bilirubin Urine: NEGATIVE
Glucose, UA: NEGATIVE mg/dL
Hgb urine dipstick: NEGATIVE
Ketones, ur: NEGATIVE mg/dL
Leukocytes, UA: NEGATIVE
Nitrite: NEGATIVE
Protein, ur: NEGATIVE mg/dL
Specific Gravity, Urine: 1.021 (ref 1.005–1.030)
Urobilinogen, UA: 0.2 mg/dL (ref 0.0–1.0)
pH: 6.5 (ref 5.0–8.0)

## 2014-12-17 LAB — HEMOGLOBIN A1C
Hgb A1c MFr Bld: 5.1 % (ref <5.7)
Mean Plasma Glucose: 100 mg/dL (ref <117)

## 2014-12-17 LAB — TSH: TSH: 1.003 u[IU]/mL (ref 0.350–4.500)

## 2014-12-19 LAB — TB SKIN TEST
Induration: 0 mm
TB Skin Test: NEGATIVE

## 2015-01-15 ENCOUNTER — Ambulatory Visit: Payer: BLUE CROSS/BLUE SHIELD | Admitting: Sports Medicine

## 2015-02-03 ENCOUNTER — Encounter: Payer: Self-pay | Admitting: Sports Medicine

## 2015-02-03 ENCOUNTER — Ambulatory Visit (INDEPENDENT_AMBULATORY_CARE_PROVIDER_SITE_OTHER): Payer: BLUE CROSS/BLUE SHIELD | Admitting: Sports Medicine

## 2015-02-03 ENCOUNTER — Ambulatory Visit (INDEPENDENT_AMBULATORY_CARE_PROVIDER_SITE_OTHER): Payer: BLUE CROSS/BLUE SHIELD

## 2015-02-03 VITALS — BP 87/52 | HR 76 | Ht 67.0 in | Wt 128.0 lb

## 2015-02-03 DIAGNOSIS — R591 Generalized enlarged lymph nodes: Secondary | ICD-10-CM

## 2015-02-03 DIAGNOSIS — J0101 Acute recurrent maxillary sinusitis: Secondary | ICD-10-CM | POA: Diagnosis not present

## 2015-02-03 MED ORDER — FEXOFENADINE-PSEUDOEPHED ER 180-240 MG PO TB24: 1.0000 | ORAL_TABLET | Freq: Every day | ORAL | Status: DC

## 2015-02-03 MED ORDER — AMOXICILLIN-POT CLAVULANATE 875-125 MG PO TABS: 1.0000 | ORAL_TABLET | Freq: Two times a day (BID) | ORAL | Status: DC

## 2015-02-03 MED ORDER — AZELASTINE-FLUTICASONE 137-50 MCG/ACT NA SUSP: NASAL | Status: DC

## 2015-02-03 NOTE — Assessment & Plan Note (Signed)
We are going to now get aggressive in terms of treatment. Dymista, Augmentin,Allegra-D, CT sinuses. Return to see me in 2-3 weeks.

## 2015-02-03 NOTE — Progress Notes (Signed)
  Subjective:    CC: follow-up  HPI: Sinus infection: Maxillary sinusitis treated by urgent care, not sure if she's had persistent symptoms now for the past month and a half to 2 months or if this represents a new episode however she does have pain over the frontal as well as maxillary sinuses radiating to the teeth, moderate, persistent with minimal nasal discharge.she has failed several courses of antibiotics, nasal steroids, decongestants.  Lymphadenopathy: Persistent now for almost 2 months. No fevers, chills, night sweats, weight loss, she does have a family history of non-Hodgkin's lymphoma.  Past medical history, Surgical history, Family history not pertinant except as noted below, Social history, Allergies, and medications have been entered into the medical record, reviewed, and no changes needed.   Review of Systems: No fevers, chills, night sweats, weight loss, chest pain, or shortness of breath.   Objective:    General: Well Developed, well nourished, and in no acute distress.  Neuro: Alert and oriented x3, extra-ocular muscles intact, sensation grossly intact.  HEENT: Normocephalic, atraumatic, pupils equal round reactive to light, neck supple, no masses, cervical lymphadenopathy continues to be present both in the right and left jugulodigastric regions, over 1 cm in diameter, nontender, movable, well-defined, thyroid nonpalpable. Oropharynx unremarkable, nasopharynx shows boggy and erythematous turbinates, mucosa is very mucinous, she has a positive transillumination test on the right, external ear canals are unremarkable. Skin: Warm and dry, no rashes. Cardiac: Regular rate and rhythm, no murmurs rubs or gallops, no lower extremity edema.  Respiratory: Clear to auscultation bilaterally. Not using accessory muscles, speaking in full sentences.  Impression and Recommendations:

## 2015-02-03 NOTE — Assessment & Plan Note (Addendum)
Persistent. Ultrasound, CBC. On further evaluation it does appear as though she had a CT scan in 2009 that showed persistence of this lymphadenopathy. If persistent on ultrasound we may consider referral for biopsy. Awaiting blood work.

## 2015-02-04 LAB — COMPREHENSIVE METABOLIC PANEL
ALT: 14 U/L (ref 0–35)
AST: 19 U/L (ref 0–37)
Albumin: 4.3 g/dL (ref 3.5–5.2)
Alkaline Phosphatase: 54 U/L (ref 39–117)
CO2: 29 mEq/L (ref 19–32)
Calcium: 9.1 mg/dL (ref 8.4–10.5)
Chloride: 104 mEq/L (ref 96–112)
Creat: 0.7 mg/dL (ref 0.50–1.10)
Potassium: 4 mEq/L (ref 3.5–5.3)
Sodium: 140 mEq/L (ref 135–145)
Total Protein: 6.9 g/dL (ref 6.0–8.3)

## 2015-02-04 LAB — CBC WITH DIFFERENTIAL/PLATELET
Basophils Absolute: 0 10*3/uL (ref 0.0–0.1)
Basophils Relative: 0 % (ref 0–1)
Eosinophils Absolute: 0.4 K/uL (ref 0.0–0.7)
Eosinophils Relative: 6 % — ABNORMAL HIGH (ref 0–5)
HCT: 37.3 % (ref 36.0–46.0)
Hemoglobin: 12.6 g/dL (ref 12.0–15.0)
Lymphocytes Relative: 36 % (ref 12–46)
Lymphs Abs: 2.4 10*3/uL (ref 0.7–4.0)
MCH: 29.2 pg (ref 26.0–34.0)
MCHC: 33.8 g/dL (ref 30.0–36.0)
MCV: 86.5 fL (ref 78.0–100.0)
MPV: 9.4 fL (ref 8.6–12.4)
Monocytes Absolute: 0.5 10*3/uL (ref 0.1–1.0)
Monocytes Relative: 7 % (ref 3–12)
Neutro Abs: 3.5 K/uL (ref 1.7–7.7)
Neutrophils Relative %: 51 % (ref 43–77)
Platelets: 268 10*3/uL (ref 150–400)
RBC: 4.31 MIL/uL (ref 3.87–5.11)
RDW: 13.1 % (ref 11.5–15.5)
WBC: 6.8 10*3/uL (ref 4.0–10.5)

## 2015-02-04 LAB — COMPREHENSIVE METABOLIC PANEL WITH GFR
BUN: 10 mg/dL (ref 6–23)
Glucose, Bld: 83 mg/dL (ref 70–99)
Total Bilirubin: 0.4 mg/dL (ref 0.2–1.2)

## 2015-02-18 ENCOUNTER — Ambulatory Visit (INDEPENDENT_AMBULATORY_CARE_PROVIDER_SITE_OTHER): Payer: BLUE CROSS/BLUE SHIELD

## 2015-02-18 ENCOUNTER — Other Ambulatory Visit: Payer: Self-pay | Admitting: Sports Medicine

## 2015-02-18 DIAGNOSIS — J0101 Acute recurrent maxillary sinusitis: Secondary | ICD-10-CM

## 2015-02-18 DIAGNOSIS — R591 Generalized enlarged lymph nodes: Secondary | ICD-10-CM | POA: Diagnosis not present

## 2015-02-18 DIAGNOSIS — M2740 Unspecified cyst of jaw: Secondary | ICD-10-CM

## 2015-02-19 ENCOUNTER — Telehealth: Payer: Self-pay

## 2015-02-19 MED ORDER — FLUCONAZOLE 150 MG PO TABS: 150.0000 mg | ORAL_TABLET | Freq: Once | ORAL | Status: DC

## 2015-02-19 NOTE — Telephone Encounter (Signed)
Austin, thank you, I will await the ear nose and throat consult note.

## 2015-02-19 NOTE — Telephone Encounter (Signed)
Spoke to patient gave her CT results.  She is feeling a little better with the exception of  her abdominal pain from the antibiotic ,she also stated that she has a yeast infection from the antibiotic that she has been using OTC products but nothing was working. I sent in a Rx for Fluconazole 150 mg to [facility]. Estelle Junehonda Cunningham,CMA

## 2015-02-26 ENCOUNTER — Emergency Department
Admission: EM | Admit: 2015-02-26 | Discharge: 2015-02-26 | Disposition: A | Discharge status: Home / Self Care | Payer: BLUE CROSS/BLUE SHIELD | Source: Home / Self Care | Attending: Emergency Medicine | Admitting: Emergency Medicine

## 2015-02-26 ENCOUNTER — Encounter: Payer: Self-pay | Admitting: *Deleted

## 2015-02-26 DIAGNOSIS — J0111 Acute recurrent frontal sinusitis: Secondary | ICD-10-CM

## 2015-02-26 DIAGNOSIS — J0101 Acute recurrent maxillary sinusitis: Secondary | ICD-10-CM

## 2015-02-26 MED ORDER — HYDROCODONE-ACETAMINOPHEN 5-325 MG PO TABS: 1.0000 | ORAL_TABLET | ORAL | Status: DC | PRN

## 2015-02-26 MED ORDER — PREDNISONE 50 MG PO TABS: 50.0000 mg | ORAL_TABLET | Freq: Every day | ORAL | Status: DC

## 2015-02-26 MED ORDER — FLUTICASONE PROPIONATE 50 MCG/ACT NA SUSP: NASAL | Status: DC

## 2015-02-26 MED ORDER — LEVOFLOXACIN 500 MG PO TABS: ORAL_TABLET | ORAL | Status: DC

## 2015-02-26 NOTE — ED Notes (Signed)
Pt c/o 3 months of sinus and facial pain, congestion without much drainage. Afebrile. Taken Sudafed sinus otc without much relief. Given Augmentin by PCP recently, she had GI pain with taking medication so she discontinued after 4 doses. Reports PCP office would not change medication. She has apt with ENT in 3 weeks but is in a lot of pain.

## 2015-02-26 NOTE — ED Provider Notes (Signed)
CSN: [id number]     Arrival date & time 02/26/15  1523 History   First MD Initiated Contact with Patient 02/26/15 1604     Chief Complaint  Patient presents with  . Facial Pain  . Nasal Congestion   (Consider location/radiation/quality/duration/timing/severity/associated sxs/prior Treatment) HPI Pt c/o 3 months of sinus and facial pain, congestion with mild discolored drainage. Afebrile. Taken Sudafed sinus otc without much relief. Given Augmentin by PCP recently, she had GI pain with taking medication so she discontinued after 4 doses. Reports PCP office would not change medication. Reviewing her EMR records, over the past 2 months, she's been on Omnicef, Zithromax, and recently Augmentin.  She has apt with ENT in 3 weeks but is in a lot of pain. Had low-grade fever. Still discolored rhinorrhea.  She denies chance of pregnancy. LMP ended yesterday. Past Medical History  Diagnosis Date  . Heart palpitations   . GERD (gastroesophageal reflux disease)   . Barrett esophagus   . Hiatal hernia   . Anxiety    Past Surgical History  Procedure Laterality Date  . Tonsillectomy    . Wisdom tooth extraction    . Myringotomy with tube placement     Family History  Problem Relation Age of Onset  . Vitamin D deficiency Mother   . Non-Hodgkin's lymphoma Paternal Grandfather   . Depression Paternal Aunt   . Diabetes Maternal Grandmother   . Diabetes Maternal Grandfather   . Lung cancer Maternal Grandfather    History  Substance Use Topics  . Smoking status: Never Smoker   . Smokeless tobacco: Never Used  . Alcohol Use: Yes     Comment: occasionally   OB History    No data available     Review of Systems Remainder of Review of Systems negative for acute change except as noted in the HPI.  Allergies  Augmentin  Home Medications   Prior to Admission medications   Medication Sig Start Date End Date Taking? Authorizing Provider  Azelastine-Fluticasone 137-50 MCG/ACT SUSP One  spray each nostril BID 02/03/15   Monica Bectonhomas J Thekkekandam, MD  fexofenadine-pseudoephedrine (ALLEGRA-D 24) 180-240 MG per 24 hr tablet Take 1 tablet by mouth daily. 02/03/15   Monica Bectonhomas J Thekkekandam, MD  fluticasone Aleda Grana(FLONASE) 50 MCG/ACT nasal spray 1 or 2 sprays each nostril twice a day 02/26/15   Lajean Manesavid Massey, MD  HYDROcodone-acetaminophen (NORCO/VICODIN) 5-325 MG per tablet Take 1-2 tablets by mouth every 4 (four) hours as needed for severe pain. Take with food. 02/26/15   Lajean Manesavid Massey, MD  levofloxacin (LEVAQUIN) 500 MG tablet Take 1 tablet daily X 10 days. 02/26/15   Lajean Manesavid Massey, MD  LORazepam (ATIVAN) 0.5 MG tablet Take 1 tablet (0.5 mg total) by mouth 2 (two) times daily. 07/29/14   Hart Carwinora M Brodie, MD  pantoprazole (PROTONIX) 40 MG tablet Take 1 tablet (40 mg total) by mouth 2 (two) times daily. 07/29/14   Hart Carwinora M Brodie, MD  predniSONE (DELTASONE) 50 MG tablet Take 1 tablet (50 mg total) by mouth daily. With food for 5 days. 02/26/15   Lajean Manesavid Massey, MD   BP 118/79 mmHg  Pulse 67  Temp(Src) 97.8 F (36.6 C) (Oral)  Resp 16  Wt 128 lb (58.06 kg)  SpO2 99%  LMP 02/09/2015 Physical Exam  Constitutional: She is oriented to person, place, and time. She appears well-developed and well-nourished. No distress.  HENT:  Head: Normocephalic and atraumatic.  Right Ear: Tympanic membrane, external ear and ear canal normal.  Left Ear: Tympanic  membrane, external ear and ear canal normal.  Nose: Mucosal edema and rhinorrhea present. Right sinus exhibits maxillary sinus tenderness. Left sinus exhibits maxillary sinus tenderness.  Mouth/Throat: Oropharynx is clear and moist. No oral lesions. No oropharyngeal exudate.  Tender over both maxillary and frontal sinuses but especially right maxillary and frontal sinuses.  Eyes: Right eye exhibits no discharge. Left eye exhibits no discharge. No scleral icterus.  Neck: Neck supple.  Cardiovascular: Normal rate, regular rhythm and normal heart sounds.    Pulmonary/Chest: Effort normal and breath sounds normal. She has no wheezes. She has no rales.  Lymphadenopathy:    She has no cervical adenopathy.  Neurological: She is alert and oriented to person, place, and time.  Skin: Skin is warm and dry.  Nursing note and vitals reviewed.   ED Course  Procedures (including critical care time) Labs Review Labs Reviewed - No data to display  Imaging Review No results found.   MDM   1. Recurrent maxillary sinusitis, unspecified chronicity   2. Acute recurrent frontal sinusitis    Treatment options discussed, as well as risks, benefits, alternatives. Patient voiced understanding and agreement with the following plans:  Handwritten prescription for Vicodin. #12. No refills. One by mouth every 4-6 hours prn severe pain. Electronically prescribed: Levaquin 500 mg daily 10 days Flonase Prednisone 50 mg po daily 5 days Follow-up with your ENT consultation appointment, scheduled in 3 weeks. Precautions discussed. Red flags discussed.--Emergency room if any red flag Questions invited and answered. Patient voiced understanding and agreement.   Lajean Manes, MD 02/26/15 [phone]

## 2015-04-15 ENCOUNTER — Ambulatory Visit (INDEPENDENT_AMBULATORY_CARE_PROVIDER_SITE_OTHER): Payer: BLUE CROSS/BLUE SHIELD | Admitting: Family Medicine

## 2015-04-15 ENCOUNTER — Encounter: Payer: Self-pay | Admitting: Family Medicine

## 2015-04-15 VITALS — BP 116/79 | HR 90 | Wt 130.0 lb

## 2015-04-15 DIAGNOSIS — N912 Amenorrhea, unspecified: Secondary | ICD-10-CM | POA: Diagnosis not present

## 2015-04-15 NOTE — Patient Instructions (Signed)
Thank you for coming in today. Start taking prenatal vitamins. If you are in fact pregnant we will set you up with OB/GYN Do not take sumatriptan or related medications for headache if you are pregnant. If you have questions about medicines that are safe for pregnancy asked us or a pharmacist.

## 2015-04-15 NOTE — Progress Notes (Signed)
Jennifer Bell is a 24 y.o. female who [facility]  today for Amenorrhea. Patient's last menstrual period was May 13. She's tried taking some over-the-counter pregnancy test and has had contradictory results. Some tests are positive in summary negative. She notes mild nausea and breast tenderness. She denies any bleeding abdominal pain and vomiting fevers or chills. She is not attempting to get pregnant. She has not started taking prenatal vitamins. She has never been pregnant before.   Past Medical History  Diagnosis Date  . Heart palpitations   . GERD (gastroesophageal reflux disease)   . Barrett esophagus   . Hiatal hernia   . Anxiety    Past Surgical History  Procedure Laterality Date  . Tonsillectomy    . Wisdom tooth extraction    . Myringotomy with tube placement     History  Substance Use Topics  . Smoking status: Never Smoker   . Smokeless tobacco: Never Used  . Alcohol Use: Yes     Comment: occasionally   ROS as above Medications: Current Outpatient Prescriptions  Medication Sig Dispense Refill  . fexofenadine-pseudoephedrine (ALLEGRA-D 24) 180-240 MG per 24 hr tablet Take 1 tablet by mouth daily.    . fluticasone (FLONASE) 50 MCG/ACT nasal spray 1 or 2 sprays each nostril twice a day 16 g 0  . nortriptyline (PAMELOR) 10 MG capsule Take 10 mg by mouth at bedtime.  1  . [DISCONTINUED] promethazine (PHENERGAN) 25 MG tablet Take 25 mg by mouth every 6 (six) hours as needed for nausea or vomiting.     No current facility-administered medications for this visit.   Allergies  Allergen Reactions  . Augmentin [Amoxicillin-Pot Clavulanate] Other (See Comments)    Abdominal Pain     Exam:  BP 116/79 mmHg  Pulse 90  Wt 130 lb (58.968 kg) Gen: Well NAD HEENT: EOMI,  MMM Lungs: Normal work of breathing. CTABL Heart: RRR no MRG Abd: NABS, Soft. Nondistended, Nontender no masses palpated. Exts: Brisk capillary refill,  warm and well perfused.   No results found for this or any previous visit (from the past 24 hour(s)). No results found.   Please see individual assessment and plan sections. This visit required moderate complexity and decision making.

## 2015-04-15 NOTE — Assessment & Plan Note (Addendum)
Suspect early pregnancy. Will obtain serum quantitative beta hCG. Start prenatal vitamins. Refer to OB/GYN). May need to trend hCG. New problem moderate complexity.

## 2015-04-16 ENCOUNTER — Other Ambulatory Visit: Payer: Self-pay | Admitting: Sports Medicine

## 2015-04-16 DIAGNOSIS — N926 Irregular menstruation, unspecified: Secondary | ICD-10-CM

## 2015-04-16 LAB — HCG, QUANTITATIVE, PREGNANCY: hCG, Beta Chain, Quant, S: 41.3 m[IU]/mL

## 2015-04-18 LAB — HCG, QUANTITATIVE, PREGNANCY: HCG, BETA CHAIN, QUANT, S: 98 m[IU]/mL

## 2015-05-26 LAB — OB RESULTS CONSOLE HEPATITIS B SURFACE ANTIGEN: HEP B S AG: NEGATIVE

## 2015-05-26 LAB — OB RESULTS CONSOLE ABO/RH: RH TYPE: POSITIVE

## 2015-05-26 LAB — OB RESULTS CONSOLE ANTIBODY SCREEN: Antibody Screen: NEGATIVE

## 2015-05-26 LAB — OB RESULTS CONSOLE GC/CHLAMYDIA
Chlamydia: NEGATIVE
Gonorrhea: NEGATIVE

## 2015-05-26 LAB — OB RESULTS CONSOLE RUBELLA ANTIBODY, IGM: RUBELLA: IMMUNE

## 2015-08-12 ENCOUNTER — Encounter (HOSPITAL_COMMUNITY): Payer: Self-pay | Admitting: *Deleted

## 2015-08-12 ENCOUNTER — Inpatient Hospital Stay (HOSPITAL_COMMUNITY): Payer: BC Managed Care – PPO

## 2015-08-12 ENCOUNTER — Inpatient Hospital Stay (HOSPITAL_COMMUNITY)
Admission: AD | Admit: 2015-08-12 | Discharge: 2015-08-12 | Disposition: A | Discharge status: Home / Self Care | Payer: BC Managed Care – PPO | Source: Ambulatory Visit | Attending: Obstetrics and Gynecology | Admitting: Obstetrics and Gynecology

## 2015-08-12 DIAGNOSIS — R1032 Left lower quadrant pain: Secondary | ICD-10-CM | POA: Diagnosis present

## 2015-08-12 DIAGNOSIS — O26892 Other specified pregnancy related conditions, second trimester: Secondary | ICD-10-CM | POA: Diagnosis not present

## 2015-08-12 DIAGNOSIS — R109 Unspecified abdominal pain: Secondary | ICD-10-CM

## 2015-08-12 DIAGNOSIS — O26899 Other specified pregnancy related conditions, unspecified trimester: Secondary | ICD-10-CM

## 2015-08-12 DIAGNOSIS — M549 Dorsalgia, unspecified: Secondary | ICD-10-CM

## 2015-08-12 DIAGNOSIS — O3482 Maternal care for other abnormalities of pelvic organs, second trimester: Secondary | ICD-10-CM

## 2015-08-12 DIAGNOSIS — Z3A20 20 weeks gestation of pregnancy: Secondary | ICD-10-CM

## 2015-08-12 DIAGNOSIS — N83202 Unspecified ovarian cyst, left side: Secondary | ICD-10-CM

## 2015-08-12 DIAGNOSIS — O9989 Other specified diseases and conditions complicating pregnancy, childbirth and the puerperium: Secondary | ICD-10-CM

## 2015-08-12 DIAGNOSIS — N83209 Unspecified ovarian cyst, unspecified side: Secondary | ICD-10-CM

## 2015-08-12 LAB — CBC
HCT: 33 % — ABNORMAL LOW (ref 36.0–46.0)
HEMOGLOBIN: 11.1 g/dL — AB (ref 12.0–15.0)
MCH: 30 pg (ref 26.0–34.0)
MCHC: 33.6 g/dL (ref 30.0–36.0)
MCV: 89.2 fL (ref 78.0–100.0)
Platelets: 216 10*3/uL (ref 150–400)
RBC: 3.7 MIL/uL — ABNORMAL LOW (ref 3.87–5.11)
RDW: 12.9 % (ref 11.5–15.5)
WBC: 11.5 10*3/uL — ABNORMAL HIGH (ref 4.0–10.5)

## 2015-08-12 LAB — URINALYSIS, ROUTINE W REFLEX MICROSCOPIC
Bilirubin Urine: NEGATIVE
Glucose, UA: NEGATIVE mg/dL
Hgb urine dipstick: NEGATIVE
KETONES UR: NEGATIVE mg/dL
LEUKOCYTES UA: NEGATIVE
NITRITE: NEGATIVE
PH: 7 (ref 5.0–8.0)
Protein, ur: NEGATIVE mg/dL
Specific Gravity, Urine: 1.015 (ref 1.005–1.030)
UROBILINOGEN UA: 0.2 mg/dL (ref 0.0–1.0)

## 2015-08-12 MED ORDER — OXYCODONE-ACETAMINOPHEN 5-325 MG PO TABS: 1.0000 | ORAL_TABLET | Freq: Four times a day (QID) | ORAL | Status: DC | PRN

## 2015-08-12 MED ORDER — OXYCODONE-ACETAMINOPHEN 5-325 MG PO TABS
1.0000 | ORAL_TABLET | Freq: Once | ORAL | Status: AC
Start: 2015-08-12 — End: 2015-08-12
  Administered 2015-08-12: 1 via ORAL
  Filled 2015-08-12: qty 1

## 2015-08-12 NOTE — Discharge Instructions (Signed)

## 2015-08-12 NOTE — MAU Note (Signed)
Pt presents to MAU with complaints of pain in the lower left side of her abdomen. States it started on the way to work this morning and is a constant pain. Was evaluated by Dr Ambrose MantleHenley this morning in the office

## 2015-08-12 NOTE — MAU Provider Note (Signed)
Chief Complaint:  Abdominal Pain   First Provider Initiated Contact with Patient 08/12/15 1047      HPI: Jennifer Bell is a 24 y.o. G1P0 at [redacted]w[redacted]d who presents to maternity admissions reporting acute onset of LLQ pain while driving to work this morning.  She reports pain is sharp/squeezing pain and is constant but worsens when standing, especially when weight bearing on left leg.  The pain starts in her left lower abdomen but radiates to her left lower back as well.  She reports urine and bowel habits have been normal.  She denies recent changes in activity or known injury. She reports fetal movement, denies LOF, vaginal bleeding, vaginal itching/burning, urinary symptoms, h/a, dizziness, n/v, or fever/chills.    Abdominal Pain This is a new problem. The current episode started today. The onset quality is sudden. The problem occurs constantly. The problem has been waxing and waning. The pain is located in the LLQ. The pain is moderate. The quality of the pain is sharp. The abdominal pain radiates to the back. Pertinent negatives include no constipation, diarrhea, dysuria, fever, frequency, headaches, nausea or vomiting. The pain is aggravated by movement and certain positions. The pain is relieved by nothing. She has tried acetaminophen (@ 0700) for the symptoms. The treatment provided no relief.    Past Medical History: Past Medical History  Diagnosis Date  . Heart palpitations   . GERD (gastroesophageal reflux disease)   . Barrett esophagus   . Hiatal hernia   . Anxiety     Past obstetric history: OB History  Gravida Para Term Preterm AB SAB TAB Ectopic Multiple Living  1             # Outcome Date GA Lbr Len/2nd Weight Sex Delivery Anes PTL Lv  1 Current               Past Surgical History: Past Surgical History  Procedure Laterality Date  . Tonsillectomy    . Wisdom tooth extraction    . Myringotomy with tube placement      Family History: Family History  Problem  Relation Age of Onset  . Vitamin D deficiency Mother   . Non-Hodgkin's lymphoma Paternal Grandfather   . Depression Paternal Aunt   . Diabetes Maternal Grandmother   . Diabetes Maternal Grandfather   . Lung cancer Maternal Grandfather     Social History: Social History  Substance Use Topics  . Smoking status: Never Smoker   . Smokeless tobacco: Never Used  . Alcohol Use: No    Allergies:  Allergies  Allergen Reactions  . Augmentin [Amoxicillin-Pot Clavulanate] Other (See Comments)    Abdominal Pain    Meds:  Prescriptions prior to admission  Medication Sig Dispense Refill Last Dose  . acetaminophen (TYLENOL) 500 MG tablet Take 500 mg by mouth every 6 (six) hours as needed for mild pain.   08/12/2015 at Unknown time  . omeprazole (PRILOSEC) 40 MG capsule Take 40 mg by mouth daily.   08/12/2015 at Unknown time  . fexofenadine-pseudoephedrine (ALLEGRA-D 24) 180-240 MG per 24 hr tablet Take 1 tablet by mouth daily. (Patient not taking: Reported on 08/12/2015)   Taking  . fluticasone (FLONASE) 50 MCG/ACT nasal spray 1 or 2 sprays each nostril twice a day (Patient not taking: Reported on 08/12/2015) 16 g 0 Taking    ROS:  Review of Systems  Constitutional: Negative for fever, chills and fatigue.  HENT: Negative for sinus pressure.   Eyes: Negative for photophobia.  Respiratory: Negative for shortness of breath.   Cardiovascular: Negative for chest pain.  Gastrointestinal: Positive for abdominal pain. Negative for nausea, vomiting, diarrhea and constipation.  Genitourinary: Negative for dysuria, frequency, flank pain, vaginal bleeding, vaginal discharge, difficulty urinating, vaginal pain and pelvic pain.  Musculoskeletal: Negative for neck pain.  Neurological: Negative for dizziness, weakness and headaches.  Psychiatric/Behavioral: Negative.      I have reviewed patient's Past Medical Hx, Surgical Hx, Family Hx, Social Hx, medications and allergies.   Physical Exam  Patient  Vitals for the past 24 hrs:  BP Temp Pulse Resp  08/12/15 1011 113/58 mmHg 97.9 F (36.6 C) 75 18   Constitutional: Well-developed, well-nourished female in no acute distress.  Cardiovascular: normal rate Respiratory: normal effort GI: Abd soft, non-tender, gravid appropriate for gestational age.  MS: Extremities nontender, no edema, normal ROM Neurologic: Alert and oriented x 4.  GU: Neg CVAT.  PELVIC EXAM: closed/thick/high, posterior     FHT:  145 by doppler   Labs: Results for orders placed or performed during the hospital encounter of 08/12/15 (from the past 24 hour(s))  Urinalysis, Routine w reflex microscopic (not at [facility])     Status: Abnormal   Collection Time: 08/12/15 10:15 AM  Result Value Ref Range   Color, Urine YELLOW YELLOW   APPearance CLOUDY (A) CLEAR   Specific Gravity, Urine 1.015 1.005 - 1.030   pH 7.0 5.0 - 8.0   Glucose, UA NEGATIVE NEGATIVE mg/dL   Hgb urine dipstick NEGATIVE NEGATIVE   Bilirubin Urine NEGATIVE NEGATIVE   Ketones, ur NEGATIVE NEGATIVE mg/dL   Protein, ur NEGATIVE NEGATIVE mg/dL   Urobilinogen, UA 0.2 0.0 - 1.0 mg/dL   Nitrite NEGATIVE NEGATIVE   Leukocytes, UA NEGATIVE NEGATIVE      Imaging:  No results found.  US Pelvis Complete  08/12/2015  CLINICAL DATA:  Lower abdominal pain, [redacted] weeks pregnant. EXAM: TRANSABDOMINAL ULTRASOUND OF PELVIS DOPPLER ULTRASOUND OF OVARIES TECHNIQUE: Transabdominal ultrasound examination of the pelvis was performed including evaluation of the ovaries, adnexal regions, and pelvic cul-de-sac. Color and duplex Doppler ultrasound was utilized to evaluate blood flow to the ovaries. Both transabdominal and transvaginal ultrasound examinations of the pelvis were performed. Transabdominal technique was performed for global imaging of the adnexa COMPARISON:  Obstetric ultrasound, dictated separately. 08/11/2005 abdominal pelvic CT. FINDINGS: Right ovary Measurements: 3.4 x 1.5 x 1.2 cm. Normal appearance/no adnexal  mass. Left ovary Measurements: 3.2 x 1.8 x 1.6 cm. Normal appearance/no adnexal mass. Pulsed Doppler evaluation demonstrates normal low-resistance arterial and venous waveforms in both ovaries. No significant free fluid. IMPRESSION: No evidence of ovarian/adnexal mass or torsion. Electronically Signed   By: Jeronimo Greaves M.D.   On: 08/12/2015 15:47   US Renal  08/12/2015  CLINICAL DATA:  Patient with left lower quadrant pain. EXAM: RENAL / URINARY TRACT ULTRASOUND COMPLETE COMPARISON:  None. FINDINGS: Right Kidney: Length: 10.9 cm. Echogenicity within normal limits. No mass or hydronephrosis visualized. Left Kidney: Length: 12.1 cm. Echogenicity within normal limits. No mass or hydronephrosis visualized. Bladder: Appears normal for degree of bladder distention. IMPRESSION: No hydronephrosis. Electronically Signed   By: Annia Belt M.D.   On: 08/12/2015 13:02   [place] Art/ven Flow Abd Pelv Doppler  08/12/2015  CLINICAL DATA:  Lower abdominal pain, [redacted] weeks pregnant. EXAM: TRANSABDOMINAL ULTRASOUND OF PELVIS DOPPLER ULTRASOUND OF OVARIES TECHNIQUE: Transabdominal ultrasound examination of the pelvis was performed including evaluation of the ovaries, adnexal regions, and pelvic cul-de-sac. Color and duplex Doppler ultrasound was utilized  to evaluate blood flow to the ovaries. Both transabdominal and transvaginal ultrasound examinations of the pelvis were performed. Transabdominal technique was performed for global imaging of the adnexa COMPARISON:  Obstetric ultrasound, dictated separately. 08/11/2005 abdominal pelvic CT. FINDINGS: Right ovary Measurements: 3.4 x 1.5 x 1.2 cm. Normal appearance/no adnexal mass. Left ovary Measurements: 3.2 x 1.8 x 1.6 cm. Normal appearance/no adnexal mass. Pulsed Doppler evaluation demonstrates normal low-resistance arterial and venous waveforms in both ovaries. No significant free fluid. IMPRESSION: No evidence of ovarian/adnexal mass or torsion. Electronically Signed   By: Jeronimo GreavesKyle   Talbot M.D.   On: 08/12/2015 15:47   [place] Mfm Ob Limited  08/12/2015  OBSTETRICAL ULTRASOUND: This exam was performed within a [place] Ultrasound Department. The OB US report was generated in the AS system, and faxed to the ordering physician.  This report is available in the [employer] PACS. See the AS Obstetric US report via the Image Link.   MAU Course/MDM: I have ordered labs and imaging and reviewed results.  Consult Dr Ambrose MantleHenley.  No evidence of renal stones in urine result or on ultrasound. Limited OB US normal, with no problems with placenta and small 2 cm left ovarian cyst noted with normal dopplers on pelvic ultrasound ruling out ovarian torsion as cause for pain.    Treatments in MAU included Percocet 5/325 x 1 tab with pt report of decreased pain.  Pt to go home with pain management and follow up if persists/worsens.  Pt stable at time of discharge.  Assessment: 1. Abdominal pain affecting pregnancy, antepartum   2. LLQ pain   3. Back pain affecting pregnancy in second trimester     Plan: Discharge home with acute abdomen precautions Preterm labor precautions and fetal kick counts F/U in office as scheduled Return to MAU as needed for emergencies    Medication List    ASK your doctor about these medications        acetaminophen 500 MG tablet  Commonly known as:  TYLENOL  Take 500 mg by mouth every 6 (six) hours as needed for mild pain.     fexofenadine-pseudoephedrine 180-240 MG 24 hr tablet  Commonly known as:  ALLEGRA-D 24  Take 1 tablet by mouth daily.     fluticasone 50 MCG/ACT nasal spray  Commonly known as:  FLONASE  1 or 2 sprays each nostril twice a day     omeprazole 40 MG capsule  Commonly known as:  PRILOSEC  Take 40 mg by mouth daily.        Sharen CounterLisa Leftwich-Kirby Certified Nurse-Midwife 08/12/2015 11:09 AM

## 2015-09-16 ENCOUNTER — Encounter: Payer: Self-pay | Admitting: Osteopathic Medicine

## 2015-09-16 ENCOUNTER — Ambulatory Visit (INDEPENDENT_AMBULATORY_CARE_PROVIDER_SITE_OTHER): Payer: BLUE CROSS/BLUE SHIELD | Admitting: Osteopathic Medicine

## 2015-09-16 VITALS — BP 121/68 | HR 84 | Temp 97.8°F | Ht 68.0 in | Wt 167.0 lb

## 2015-09-16 DIAGNOSIS — J329 Chronic sinusitis, unspecified: Secondary | ICD-10-CM | POA: Diagnosis not present

## 2015-09-16 DIAGNOSIS — A499 Bacterial infection, unspecified: Secondary | ICD-10-CM

## 2015-09-16 DIAGNOSIS — B9689 Other specified bacterial agents as the cause of diseases classified elsewhere: Secondary | ICD-10-CM

## 2015-09-16 MED ORDER — CEFDINIR 300 MG PO CAPS: 300.0000 mg | ORAL_CAPSULE | Freq: Two times a day (BID) | ORAL | Status: DC

## 2015-09-16 NOTE — Patient Instructions (Signed)
Ok to use Tylenol, Claritin, Flonase for symptomatic relief.  Please let us know if you're not better in the next few days to a week!

## 2015-09-16 NOTE — Progress Notes (Signed)
HPI: Jennifer Bell is a 24 y.o. female who [facility] [place] today for chief complaint of:  Chief Complaint  Patient presents with  . Cough    . Location: headache . Quality: started as a cold but seems to have hung on and gotten worse . Severity: . Duration: 1 month ago started . Timing: . Context:  . Modifying factors: Neti-Pot and Tylenol for the past 2 weeks. Pharmacist  . Assoc signs/symptoms: sinus headache is worse, cough    Past medical, social and family history reviewed: Past Medical History  Diagnosis Date  . Heart palpitations   . GERD (gastroesophageal reflux disease)   . Barrett esophagus   . Hiatal hernia   . Anxiety    Past Surgical History  Procedure Laterality Date  . Tonsillectomy    . Wisdom tooth extraction    . Myringotomy with tube placement     Social History  Substance Use Topics  . Smoking status: Never Smoker   . Smokeless tobacco: Never Used  . Alcohol Use: No   Family History  Problem Relation Age of Onset  . Vitamin D deficiency Mother   . Non-Hodgkin's lymphoma Paternal Grandfather   . Depression Paternal Aunt   . Diabetes Maternal Grandmother   . Diabetes Maternal Grandfather   . Lung cancer Maternal Grandfather     Current Outpatient Prescriptions  Medication Sig Dispense Refill  . omeprazole (PRILOSEC) 40 MG capsule Take 40 mg by mouth daily.    Marland Kitchen. oxyCODONE-acetaminophen (PERCOCET/ROXICET) 5-325 MG tablet Take 1-2 tablets by mouth every 6 (six) hours as needed for severe pain. (Patient not taking: Reported on 09/16/2015) 15 tablet 0  . [DISCONTINUED] promethazine (PHENERGAN) 25 MG tablet Take 25 mg by mouth every 6 (six) hours as needed for nausea or vomiting.     No current facility-administered medications for this visit.   Allergies  Allergen Reactions  . Augmentin [Amoxicillin-Pot Clavulanate] Other (See Comments)    Abdominal Pain      Review of Systems: CONSTITUTIONAL:   No  fever, no chills, No  unintentional weight changes HEAD/EYES/EARS/NOSE/THROAT: Yes  headache, no vision change, no hearing change, mold sore throat, Yes  sinus pressure CARDIAC: No  chest pain, No  pressure, No palpitations, No  orthopnea RESPIRATORY: occasional dry cough, No  shortness of breath/wheeze   Exam:  BP 121/68 mmHg  Pulse 84  Temp(Src) 97.8 F (36.6 C) (Oral)  Ht 5\' 8"  (1.727 m)  Wt 167 lb (75.751 kg)  BMI 25.40 kg/m2  LMP 03/20/2015 Constitutional: VS see above. General Appearance: alert, well-developed, well-nourished, NAD Eyes: Normal lids and conjunctive, non-icteric sclera, PERRLA Ears, Nose, Mouth, Throat: MMM, Normal external inspection ears/nares/mouth/lips/gums, TM normal with mild scar tissue from tympanostomy an mild effusion behind R TM, posterior pharynx No  erythema No  Exudate, (+) frontal and maxillary sinus tenderness Neck: No masses, trachea midline. No thyroid enlargement/tenderness/mass appreciated. No lymphadenopathy Respiratory: Normal respiratory effort. no wheeze, no rhonchi, no rales Cardiovascular: S1/S2 normal, no murmur, no rub/gallop auscultated. RRR.    No results found for this or any previous visit (from the past 72 hour(s)).  OMT: lymphatic drainage technique administered for sinus/cervical lymphatics to (+) patient relief   ASSESSMENT/PLAN: Loratidine/Claritin ok to use, Atrovent nasal spray (Category B), Flonase (intranasal ok per UpToDate info).   Bacterial sinusitis - Plan: cefdinir (OMNICEF) 300 MG capsule     Return if symptoms worsen or fail to improve.

## 2015-10-02 LAB — OB RESULTS CONSOLE HIV ANTIBODY (ROUTINE TESTING): HIV: NONREACTIVE

## 2015-10-02 LAB — OB RESULTS CONSOLE RPR: RPR: NONREACTIVE

## 2015-10-04 NOTE — L&D Delivery Note (Signed)
Delivery Note Pt progressed appropriately through the night and was 10cm at 0530 AM.  She was allowed to labor down for 1 1/2 hour and then began pushed well for about an hour.  At 7:56 AM a healthy female was delivered via Vaginal, Spontaneous Delivery (Presentation: Middle Occiput Anterior).  APGAR: 7, 9; weight  pending.   Placenta status: Intact, Spontaneous.  Cord: 3 vessels with the following complications: Nuchal x 1 reduced.  Anesthesia: Epidural  Episiotomy: None Lacerations: 2nd degree Suture Repair: 3.0 vicryl rapide Est. Blood Loss (mL):  175ml  Mom to postpartum.  Baby to Couplet care / Skin to Skin. D/w pt and husband circumcision and they would like to proceed. BP has been stable, will watch postpartum.  Oliver PilaRICHARDSON,Lonie Rummell W 12/20/2015, 8:23 AM

## 2015-11-23 LAB — OB RESULTS CONSOLE GBS: GBS: NEGATIVE

## 2015-12-19 ENCOUNTER — Inpatient Hospital Stay (HOSPITAL_COMMUNITY)
Admit: 2015-12-19 | Discharge: 2015-12-22 | DRG: 775 | Disposition: A | Discharge status: Home / Self Care | Payer: BC Managed Care – PPO | Source: Ambulatory Visit | Attending: Obstetrics and Gynecology | Admitting: Obstetrics and Gynecology

## 2015-12-19 ENCOUNTER — Encounter (HOSPITAL_COMMUNITY): Payer: Self-pay | Admitting: Obstetrics

## 2015-12-19 ENCOUNTER — Inpatient Hospital Stay (HOSPITAL_COMMUNITY)
Admission: RE | Admit: 2015-12-19 | Payer: BC Managed Care – PPO | Source: Ambulatory Visit | Admitting: Obstetrics and Gynecology

## 2015-12-19 ENCOUNTER — Inpatient Hospital Stay (HOSPITAL_COMMUNITY): Payer: BC Managed Care – PPO | Admitting: Anesthesiology

## 2015-12-19 DIAGNOSIS — K2270 Barrett's esophagus without dysplasia: Secondary | ICD-10-CM | POA: Diagnosis present

## 2015-12-19 DIAGNOSIS — Z833 Family history of diabetes mellitus: Secondary | ICD-10-CM

## 2015-12-19 DIAGNOSIS — O9962 Diseases of the digestive system complicating childbirth: Secondary | ICD-10-CM | POA: Diagnosis present

## 2015-12-19 DIAGNOSIS — O1400 Mild to moderate pre-eclampsia, unspecified trimester: Secondary | ICD-10-CM | POA: Diagnosis present

## 2015-12-19 DIAGNOSIS — Z3A39 39 weeks gestation of pregnancy: Secondary | ICD-10-CM | POA: Diagnosis not present

## 2015-12-19 DIAGNOSIS — K219 Gastro-esophageal reflux disease without esophagitis: Secondary | ICD-10-CM | POA: Diagnosis present

## 2015-12-19 DIAGNOSIS — K449 Diaphragmatic hernia without obstruction or gangrene: Secondary | ICD-10-CM | POA: Diagnosis present

## 2015-12-19 DIAGNOSIS — O1404 Mild to moderate pre-eclampsia, complicating childbirth: Principal | ICD-10-CM | POA: Diagnosis present

## 2015-12-19 DIAGNOSIS — O9902 Anemia complicating childbirth: Secondary | ICD-10-CM | POA: Diagnosis not present

## 2015-12-19 LAB — CBC
HCT: 27.2 % — ABNORMAL LOW (ref 36.0–46.0)
HCT: 27.3 % — ABNORMAL LOW (ref 36.0–46.0)
HEMOGLOBIN: 8.9 g/dL — AB (ref 12.0–15.0)
Hemoglobin: 8.7 g/dL — ABNORMAL LOW (ref 12.0–15.0)
MCH: 25.9 pg — ABNORMAL LOW (ref 26.0–34.0)
MCH: 25.3 pg — AB (ref 26.0–34.0)
MCHC: 32.7 g/dL (ref 30.0–36.0)
MCHC: 31.9 g/dL (ref 30.0–36.0)
MCV: 79.3 fL (ref 78.0–100.0)
MCV: 79.4 fL (ref 78.0–100.0)
PLATELETS: 237 10*3/uL (ref 150–400)
Platelets: 230 10*3/uL (ref 150–400)
RBC: 3.43 MIL/uL — ABNORMAL LOW (ref 3.87–5.11)
RBC: 3.44 MIL/uL — AB (ref 3.87–5.11)
RDW: 14.6 % (ref 11.5–15.5)
RDW: 14.5 % (ref 11.5–15.5)
WBC: 11.4 10*3/uL — ABNORMAL HIGH (ref 4.0–10.5)
WBC: 11.9 10*3/uL — ABNORMAL HIGH (ref 4.0–10.5)

## 2015-12-19 LAB — COMPREHENSIVE METABOLIC PANEL
ALBUMIN: 2.7 g/dL — AB (ref 3.5–5.0)
ALT: 11 U/L — AB (ref 14–54)
AST: 25 U/L (ref 15–41)
Alkaline Phosphatase: 169 U/L — ABNORMAL HIGH (ref 38–126)
Anion gap: 8 (ref 5–15)
BUN: 13 mg/dL (ref 6–20)
CHLORIDE: 108 mmol/L (ref 101–111)
CO2: 18 mmol/L — ABNORMAL LOW (ref 22–32)
CREATININE: 0.6 mg/dL (ref 0.44–1.00)
Calcium: 8.5 mg/dL — ABNORMAL LOW (ref 8.9–10.3)
GFR calc Af Amer: >60 mL/min (ref >60)
GLUCOSE: 118 mg/dL — AB (ref 65–99)
POTASSIUM: 3.5 mmol/L (ref 3.5–5.1)
Sodium: 134 mmol/L — ABNORMAL LOW (ref 135–145)
Total Bilirubin: 0.5 mg/dL (ref 0.3–1.2)
Total Protein: 6 g/dL — ABNORMAL LOW (ref 6.5–8.1)

## 2015-12-19 LAB — ABO/RH: ABO/RH(D): O POS

## 2015-12-19 LAB — TYPE AND SCREEN
ABO/RH(D): O POS
ANTIBODY SCREEN: NEGATIVE

## 2015-12-19 LAB — RPR: RPR: NONREACTIVE

## 2015-12-19 MED ORDER — DIPHENHYDRAMINE HCL 50 MG/ML IJ SOLN: 12.5000 mg | INTRAMUSCULAR | Status: DC | PRN

## 2015-12-19 MED ORDER — OXYCODONE-ACETAMINOPHEN 5-325 MG PO TABS: 2.0000 | ORAL_TABLET | ORAL | Status: DC | PRN

## 2015-12-19 MED ORDER — PHENYLEPHRINE 40 MCG/ML (10ML) SYRINGE FOR IV PUSH (FOR BLOOD PRESSURE SUPPORT): 80.0000 ug | PREFILLED_SYRINGE | INTRAVENOUS | Status: DC | PRN

## 2015-12-19 MED ORDER — TERBUTALINE SULFATE 1 MG/ML IJ SOLN: 0.2500 mg | Freq: Once | INTRAMUSCULAR | Status: DC | PRN

## 2015-12-19 MED ORDER — CITRIC ACID-SODIUM CITRATE 334-500 MG/5ML PO SOLN: 30.0000 mL | ORAL | Status: DC | PRN

## 2015-12-19 MED ORDER — OXYTOCIN 10 UNIT/ML IJ SOLN
1.0000 m[IU]/min | INTRAVENOUS | Status: DC
  Administered 2015-12-19: 2 m[IU]/min via INTRAVENOUS

## 2015-12-19 MED ORDER — LACTATED RINGERS IV SOLN
500.0000 mL | INTRAVENOUS | Status: DC | PRN
  Administered 2015-12-19: 500 mL via INTRAVENOUS

## 2015-12-19 MED ORDER — ACETAMINOPHEN 325 MG PO TABS: 650.0000 mg | ORAL_TABLET | ORAL | Status: DC | PRN

## 2015-12-19 MED ORDER — TERBUTALINE SULFATE 1 MG/ML IJ SOLN
0.2500 mg | Freq: Once | INTRAMUSCULAR | Status: DC | PRN
  Filled 2015-12-19: qty 1

## 2015-12-19 MED ORDER — OXYTOCIN 10 UNIT/ML IJ SOLN
2.5000 [IU]/h | INTRAVENOUS | Status: DC
  Filled 2015-12-19: qty 10

## 2015-12-19 MED ORDER — PHENYLEPHRINE 40 MCG/ML (10ML) SYRINGE FOR IV PUSH (FOR BLOOD PRESSURE SUPPORT)
80.0000 ug | PREFILLED_SYRINGE | INTRAVENOUS | Status: DC | PRN
  Filled 2015-12-19: qty 20
  Filled 2015-12-19: qty 2

## 2015-12-19 MED ORDER — MISOPROSTOL 25 MCG QUARTER TABLET
25.0000 ug | ORAL_TABLET | ORAL | Status: DC | PRN
  Administered 2015-12-19: 25 ug via VAGINAL
  Filled 2015-12-19: qty 0.25
  Filled 2015-12-19: qty 1
  Filled 2015-12-19: qty 0.25

## 2015-12-19 MED ORDER — EPHEDRINE 5 MG/ML INJ: 10.0000 mg | INTRAVENOUS | Status: DC | PRN

## 2015-12-19 MED ORDER — FENTANYL 2.5 MCG/ML BUPIVACAINE 1/10 % EPIDURAL INFUSION (WH - ANES)
14.0000 mL/h | INTRAMUSCULAR | Status: DC | PRN
Start: 2015-12-19 — End: 2015-12-20
  Administered 2015-12-19 – 2015-12-20 (×3): 14 mL/h via EPIDURAL
  Filled 2015-12-19 (×3): qty 125

## 2015-12-19 MED ORDER — LACTATED RINGERS IV SOLN
INTRAVENOUS | Status: DC
  Administered 2015-12-19: 09:00:00 via INTRAVENOUS

## 2015-12-19 MED ORDER — LIDOCAINE HCL (PF) 1 % IJ SOLN
INTRAMUSCULAR | Status: DC | PRN
  Administered 2015-12-19: 6 mL via EPIDURAL
  Administered 2015-12-19: 4 mL

## 2015-12-19 MED ORDER — OXYCODONE-ACETAMINOPHEN 5-325 MG PO TABS: 1.0000 | ORAL_TABLET | ORAL | Status: DC | PRN

## 2015-12-19 MED ORDER — OXYTOCIN BOLUS FROM INFUSION
500.0000 mL | INTRAVENOUS | Status: DC
Start: 2015-12-19 — End: 2015-12-20

## 2015-12-19 MED ORDER — LIDOCAINE HCL (PF) 1 % IJ SOLN
30.0000 mL | INTRAMUSCULAR | Status: DC | PRN
  Filled 2015-12-19: qty 30

## 2015-12-19 MED ORDER — BUTORPHANOL TARTRATE 1 MG/ML IJ SOLN
1.0000 mg | INTRAMUSCULAR | Status: DC | PRN
  Administered 2015-12-19: 1 mg via INTRAVENOUS
  Filled 2015-12-19: qty 1

## 2015-12-19 MED ORDER — EPHEDRINE 5 MG/ML INJ
10.0000 mg | INTRAVENOUS | Status: DC | PRN
  Filled 2015-12-19: qty 2

## 2015-12-19 MED ORDER — ONDANSETRON HCL 4 MG/2ML IJ SOLN: 4.0000 mg | Freq: Four times a day (QID) | INTRAMUSCULAR | Status: DC | PRN

## 2015-12-19 MED ORDER — LACTATED RINGERS IV SOLN: 500.0000 mL | Freq: Once | INTRAVENOUS | Status: DC

## 2015-12-19 NOTE — Anesthesia Preprocedure Evaluation (Signed)
Anesthesia Evaluation  Patient identified by MRN, date of birth, ID band Patient awake    Reviewed: Allergy & Precautions, H&P , Patient's Chart, lab work & pertinent test results  Airway Mallampati: II  TM Distance: >3 FB Neck ROM: full    Dental  (+) Teeth Intact   Pulmonary    breath sounds clear to auscultation       Cardiovascular hypertension,  Rhythm:regular Rate:Normal     Neuro/Psych    GI/Hepatic   Endo/Other    Renal/GU      Musculoskeletal   Abdominal   Peds  Hematology  (+) anemia ,   Anesthesia Other Findings       Reproductive/Obstetrics (+) Pregnancy                             Anesthesia Physical Anesthesia Plan  ASA: III  Anesthesia Plan: Epidural   Post-op Pain Management:    Induction:   Airway Management Planned:   Additional Equipment:   Intra-op Plan:   Post-operative Plan:   Informed Consent: I have reviewed the patients History and Physical, chart, labs and discussed the procedure including the risks, benefits and alternatives for the proposed anesthesia with the patient or authorized representative who has indicated his/her understanding and acceptance.   Dental Advisory Given  Plan Discussed with:   Anesthesia Plan Comments: (Labs checked- platelets confirmed with RN in room. Fetal heart tracing, per RN, reported to be stable enough for sitting procedure. Discussed epidural, and patient consents to the procedure:  included risk of possible headache,backache, failed block, allergic reaction, and nerve injury. This patient was asked if she had any questions or concerns before the procedure started.)        Anesthesia Quick Evaluation

## 2015-12-19 NOTE — Anesthesia Procedure Notes (Signed)

## 2015-12-19 NOTE — Progress Notes (Signed)
Patient ID: Jennifer Bell, female   DOB: [birth year], 25 y.o.   MRN: [id number] Pt comfortable overall, some back pain  afeb vss FHR Category 1 Contractions q 1-4 min  Cervix 90/2-3/-1  IUPC placed to help adjust pitocin for adequate mvu. Follow progress,  Still latent phase labor.

## 2015-12-19 NOTE — Progress Notes (Signed)
Patient ID: Jennifer Bell, female   DOB: [birth year], 25 y.o.   MRN: [id number] Pt having mild contractions FHR category 1 Cervix 70/1/-2 AROM clear  Will start pitocin BP stable.

## 2015-12-19 NOTE — H&P (Signed)
Jennifer Bell is a 25 y.o. female G1P0 (EDD 12/25/15 by 7 week US) presenting for IOL at term for mild preeclampsia.  Pt noted to have increased BP starting about one week prior to admission to 130-140/90 range.  PIH blood work was negative and NST reactive, but 24 hour urine showed 384mg  protein c/w mild preeclampsia.  Prenatal care has been otherwise uncomplicated except a 62 lb weight gain.   Maternal Medical History:  Reason for admission: Nausea.   Contractions: Frequency: rare.   Perceived severity is mild.    Fetal activity: Perceived fetal activity is normal.    Prenatal complications: Pre-eclampsia.   Prenatal Complications - Diabetes: none.    OB History    Gravida Para Term Preterm AB TAB SAB Ectopic Multiple Living   1              Past Medical History  Diagnosis Date  . Heart palpitations   . GERD (gastroesophageal reflux disease)   . Barrett esophagus   . Hiatal hernia   . Anxiety    Past Surgical History  Procedure Laterality Date  . Tonsillectomy    . Wisdom tooth extraction    . Myringotomy with tube placement     Family History: family history includes Depression in her paternal aunt; Diabetes in her maternal grandfather and maternal grandmother; Lung cancer in her maternal grandfather; Non-Hodgkin's lymphoma in her paternal grandfather; Vitamin D deficiency in her mother. Social History:  reports that she has never smoked. She has never used smokeless tobacco. She reports that she does not drink alcohol or use illicit drugs.   Prenatal Transfer Tool  Maternal Diabetes: No Genetic Screening: Declined Maternal Ultrasounds/Referrals: Normal Fetal Ultrasounds or other Referrals:  None Maternal Substance Abuse:  No Significant Maternal Medications:  Meds include: Protonix Significant Maternal Lab Results:  None Other Comments:  None  Review of Systems  Gastrointestinal: Positive for heartburn and nausea.  Neurological: Negative for headaches.      Last menstrual period 03/20/2015. Maternal Exam:  Uterine Assessment: Contraction strength is mild.  Contraction frequency is rare.   Abdomen: Patient reports no abdominal tenderness. Fetal presentation: vertex  Introitus: Normal vulva. Normal vagina.    Physical Exam  Constitutional: She appears well-developed.  Cardiovascular: Normal rate and regular rhythm.   Respiratory: Effort normal.  GI: Soft.  Genitourinary: Vagina normal.  Neurological: She is alert. She has normal reflexes.  Psychiatric: She has a normal mood and affect.    Prenatal labs: ABO, Rh:  O positive Antibody:  negative Rubella:  Immune RPR:   NR HBsAg:   Neg HIV:   NR GBS:   Neg One hour GCT 103  Assessment/Plan: Pt with mild preeclampsia and an unfavorable cervix.  Have d/w her could be a protracted IOL.  Will start with cytotec +/- foley bulb for induction and AROM/pitocin when able.  Check PIH labs on admission.  WNL yesterday except for the increased urine protein.   No magnesium unless BP in severe range or lab abnormalities. Oliver PilaICHARDSON,[name] W 12/19/2015, 8:11 AM

## 2015-12-19 NOTE — Progress Notes (Signed)
Patient ID: Jennifer Bell, female   DOB: 30-Apr-1991, 25 y.o.   MRN: [id number] Pt with mild ctx, not really feeling them Cytotec in about 1 hour  Cervix 50/cl/-3  Labs WNL and BP stable 140-150/80's Continue cytotec

## 2015-12-20 ENCOUNTER — Encounter (HOSPITAL_COMMUNITY): Payer: Self-pay | Admitting: *Deleted

## 2015-12-20 LAB — CBC
HEMATOCRIT: 27.3 % — AB (ref 36.0–46.0)
HEMOGLOBIN: 8.7 g/dL — AB (ref 12.0–15.0)
MCH: 25.2 pg — AB (ref 26.0–34.0)
MCHC: 31.9 g/dL (ref 30.0–36.0)
MCV: 79.1 fL (ref 78.0–100.0)
Platelets: 209 10*3/uL (ref 150–400)
RBC: 3.45 MIL/uL — ABNORMAL LOW (ref 3.87–5.11)
RDW: 14.8 % (ref 11.5–15.5)
WBC: 24 10*3/uL — ABNORMAL HIGH (ref 4.0–10.5)

## 2015-12-20 MED ORDER — DIPHENHYDRAMINE HCL 25 MG PO CAPS: 25.0000 mg | ORAL_CAPSULE | Freq: Four times a day (QID) | ORAL | Status: DC | PRN

## 2015-12-20 MED ORDER — OXYCODONE HCL 5 MG PO TABS: 10.0000 mg | ORAL_TABLET | ORAL | Status: DC | PRN

## 2015-12-20 MED ORDER — LANOLIN HYDROUS EX OINT: TOPICAL_OINTMENT | CUTANEOUS | Status: DC | PRN

## 2015-12-20 MED ORDER — ONDANSETRON HCL 4 MG PO TABS: 4.0000 mg | ORAL_TABLET | ORAL | Status: DC | PRN

## 2015-12-20 MED ORDER — SODIUM BICARBONATE 8.4 % IV SOLN
INTRAVENOUS | Status: DC | PRN
  Administered 2015-12-20: 2 mL via EPIDURAL

## 2015-12-20 MED ORDER — FENTANYL CITRATE (PF) 100 MCG/2ML IJ SOLN
INTRAMUSCULAR | Status: DC | PRN
  Administered 2015-12-20: 50 ug via INTRAVENOUS
  Administered 2015-12-20: 50 ug via EPIDURAL

## 2015-12-20 MED ORDER — ACETAMINOPHEN 325 MG PO TABS
650.0000 mg | ORAL_TABLET | ORAL | Status: DC | PRN
  Administered 2015-12-20 – 2015-12-21 (×4): 650 mg via ORAL
  Filled 2015-12-20 (×4): qty 2

## 2015-12-20 MED ORDER — SIMETHICONE 80 MG PO CHEW: 80.0000 mg | CHEWABLE_TABLET | ORAL | Status: DC | PRN

## 2015-12-20 MED ORDER — BUPIVACAINE HCL (PF) 0.25 % IJ SOLN
INTRAMUSCULAR | Status: DC | PRN
  Administered 2015-12-20: 3 mL via EPIDURAL
  Administered 2015-12-20 (×2): 2 mL via EPIDURAL

## 2015-12-20 MED ORDER — PRENATAL MULTIVITAMIN CH
1.0000 | ORAL_TABLET | Freq: Every day | ORAL | Status: DC
  Administered 2015-12-20 – 2015-12-22 (×3): 1 via ORAL
  Filled 2015-12-20 (×3): qty 1

## 2015-12-20 MED ORDER — TETANUS-DIPHTH-ACELL PERTUSSIS 5-2.5-18.5 LF-MCG/0.5 IM SUSP: 0.5000 mL | Freq: Once | INTRAMUSCULAR | Status: DC

## 2015-12-20 MED ORDER — BUPIVACAINE HCL (PF) 0.25 % IJ SOLN: INTRAMUSCULAR | Status: DC | PRN

## 2015-12-20 MED ORDER — SENNOSIDES-DOCUSATE SODIUM 8.6-50 MG PO TABS
2.0000 | ORAL_TABLET | ORAL | Status: DC
  Administered 2015-12-21: 2 via ORAL
  Filled 2015-12-20 (×2): qty 2

## 2015-12-20 MED ORDER — DIBUCAINE 1 % RE OINT: 1.0000 "application " | TOPICAL_OINTMENT | RECTAL | Status: DC | PRN

## 2015-12-20 MED ORDER — ONDANSETRON HCL 4 MG/2ML IJ SOLN: 4.0000 mg | INTRAMUSCULAR | Status: DC | PRN

## 2015-12-20 MED ORDER — FENTANYL CITRATE (PF) 100 MCG/2ML IJ SOLN
INTRAMUSCULAR | Status: AC
  Filled 2015-12-20: qty 2

## 2015-12-20 MED ORDER — WITCH HAZEL-GLYCERIN EX PADS: 1.0000 "application " | MEDICATED_PAD | CUTANEOUS | Status: DC | PRN

## 2015-12-20 MED ORDER — BENZOCAINE-MENTHOL 20-0.5 % EX AERO
1.0000 "application " | INHALATION_SPRAY | CUTANEOUS | Status: DC | PRN
  Administered 2015-12-20: 1 via TOPICAL
  Filled 2015-12-20 (×2): qty 56

## 2015-12-20 MED ORDER — IBUPROFEN 600 MG PO TABS
600.0000 mg | ORAL_TABLET | Freq: Four times a day (QID) | ORAL | Status: DC
  Administered 2015-12-20 – 2015-12-22 (×8): 600 mg via ORAL
  Filled 2015-12-20 (×9): qty 1

## 2015-12-20 MED ORDER — ZOLPIDEM TARTRATE 5 MG PO TABS: 5.0000 mg | ORAL_TABLET | Freq: Every evening | ORAL | Status: DC | PRN

## 2015-12-20 MED ORDER — SODIUM BICARBONATE 8.4 % IV SOLN: INTRAVENOUS | Status: DC | PRN

## 2015-12-20 MED ORDER — OXYCODONE HCL 5 MG PO TABS: 5.0000 mg | ORAL_TABLET | ORAL | Status: DC | PRN

## 2015-12-20 NOTE — Anesthesia Postprocedure Evaluation (Signed)
Anesthesia Post Note  Patient: Jennifer Bell  Procedure(s) Performed: * No procedures listed *  Patient location during evaluation: Mother Baby Anesthesia Type: Epidural Level of consciousness: awake and alert, oriented and patient cooperative Pain management: pain level controlled Vital Signs Assessment: post-procedure vital signs reviewed and stable Respiratory status: spontaneous breathing Cardiovascular status: stable Postop Assessment: no headache, epidural receding, patient able to bend at knees and no signs of nausea or vomiting Anesthetic complications: no    Last Vitals:  Filed Vitals:   12/20/15 1105 12/20/15 1414  BP: 133/68 130/59  Pulse: 83 93  Temp: 37.4 C 36.8 C  Resp: 20 20    Last Pain:  Filed Vitals:   12/20/15 1415  PainSc: 3                  [name]

## 2015-12-20 NOTE — Lactation Note (Signed)
This note was copied from a baby's chart. Lactation Consultation Note initial visit at 13 hours of age.  Mom has baby latched on right breast with NS, Lc can visually see shaft of NS and encouraged deeper latch.  Baby does not tolerate deep latch well and mom continues to complain of pain.  Baby has short tight frenulum near tip of tongue observed when baby cries, tongue is also bowl shaped.  Baby does not extend tongue well past lower gumline when sucking on gloved finger.  Baby noted to have high "bubble type" shape to palate.  Encouraged mom to continue to attempt latch with NS and feed baby if tolerable.  Mom to pump and hand express and offer EBM to baby by spoon.  Mom is very tired and baby is content after spoon feeding of 4mls.  LC encouraged mom to rest while FOB holds baby content.  LC did not review LC services at this visit and will need to be discussed in more detail at later visit.  [facility] resources given for review.  Encouraged to feed with early cues on demand.   Mom to call for assist as needed.    Patient Name: Jennifer Bell WGNFA'OToday's Date: 12/20/2015 Reason for consult: Initial assessment   Maternal Data Has patient been taught Hand Expression?: Yes  Feeding Feeding Type: Breast Fed  LATCH Score/Interventions Latch: Too sleepy or reluctant, no latch achieved, no sucking elicited.  Audible Swallowing: None  Type of Nipple: Inverted  Comfort (Breast/Nipple): Soft / non-tender     Hold (Positioning): Assistance needed to correctly position infant at breast and maintain latch.  LATCH Score: 3  Lactation Tools Discussed/Used Tools: Nipple Shields Nipple shield size: 20 Initiated by:: setup by RN Date initiated:: 12/20/15   Consult Status Consult Status: Follow-up Date: 12/21/15    [name], [name] 12/20/2015, 10:42 PM

## 2015-12-21 LAB — CBC
HEMATOCRIT: 23.2 % — AB (ref 36.0–46.0)
HEMOGLOBIN: 7.5 g/dL — AB (ref 12.0–15.0)
MCH: 25.8 pg — AB (ref 26.0–34.0)
MCHC: 32.3 g/dL (ref 30.0–36.0)
MCV: 79.7 fL (ref 78.0–100.0)
Platelets: 183 10*3/uL (ref 150–400)
RBC: 2.91 MIL/uL — AB (ref 3.87–5.11)
RDW: 15 % (ref 11.5–15.5)
WBC: 16.2 10*3/uL — ABNORMAL HIGH (ref 4.0–10.5)

## 2015-12-21 NOTE — Progress Notes (Signed)
Post Partum Day 1 Subjective: no complaints and tolerating PO  Working on breastfeeding  Objective: Blood pressure 128/54, pulse 75, temperature 98.3 F (36.8 C), temperature source Oral, resp. rate 20, height 5\' 7"  (1.702 m), weight 89.812 kg (198 lb), last menstrual period 03/20/2015, SpO2 97 %, unknown if currently breastfeeding.  Physical Exam:  General: alert and cooperative Lochia: appropriate Uterine Fundus: firm    Recent Labs  12/20/15 0846 12/21/15 0519  HGB 8.7* 7.5*  HCT 27.3* 23.2*    Assessment/Plan: BP stable.  Hgb stable with appropriate drop from delivery, pt asymptomatic from relative anemia Will plan circumcision tomorrow per nursery request as baby not feeding well yet.   LOS: 2 days   Arwyn Besaw W 12/21/2015, 7:30 AM

## 2015-12-21 NOTE — Lactation Note (Addendum)
This note was copied from a baby's chart. Lactation Consultation Note  Mother has inverted nipples. Infant has short lingual frenulum and high palate.   Mother states latch is very painful. Mother hand expressed drops of colostrum. Attempted latching with and without nipple shield. Baby sustains latch better with NS which was prefilled with formula. Mother felt latch was pinching so resized to #24NS. Demonstrated how to prefill NS correctly. After feeding for approx 15 min off and on mother's R nipple was very tender. Taught parents how to finger feed w/ curved tip syringe.  Parents called for assistance with latching. Put mother in laid back position and attempted latching without NS. Baby did not latch successfully so applied #24NS and started 5 FR feeding SNS. Baby took approx 12 ml of formula while breastfeeding for approx 20 min. Reviewed milk storage and cleaning. Encouraged mother to post pump after every feeding with the exception of 1-2 feedings at night. Give baby back volume pumped with the difference in formula.    Patient Name: Boy Theressa StampsBrittni Ladd GNFAO'ZToday's Date: 12/21/2015 Reason for consult: Follow-up assessment   Maternal Data    Feeding Feeding Type: Breast Fed Length of feed: 15 min (off and on)  LATCH Score/Interventions Latch: Repeated attempts needed to sustain latch, nipple held in mouth throughout feeding, stimulation needed to elicit sucking reflex.  Audible Swallowing: A few with stimulation  Type of Nipple: Inverted  Comfort (Breast/Nipple): Soft / non-tender     Hold (Positioning): Assistance needed to correctly position infant at breast and maintain latch.  LATCH Score: 5  Lactation Tools Discussed/Used Tools: Nipple Shields Nipple shield size: 24   Consult Status Consult Status: Follow-up Date: 12/22/15 Follow-up type: In-patient    Dahlia ByesBerkelhammer, Ruth [facility] 12/21/2015, 12:05 PM

## 2015-12-21 NOTE — Lactation Note (Signed)
This note was copied from a baby's chart. Lactation Consultation Note Mom having difficulty latching baby d/t baby will not latch. Mom has NS on properly and has tried, baby will suckle a few times then get very fussy. Mom is very tired and wants to supplement so the baby can have something. Mom has tried spoon feeding baby and the baby gets frustrated d/t not enough and has trouble lapping colostrum from spoon. Baby given formula per mom request and LC used slow flow nipple to help baby learn to suck and swallow. Baby made squeeking noises occasionally during and after feeding. Baby has been spitty since birth, and spit formula all up during burping, uncurdled and warm milk. Has tight anterior limited mobility of tongue. Encouraged mom to rest while baby is resting. Mom stated she had been up all night with baby. Gave syring and formula, instructed and taught mom how to insert measured formula from cup into NS before latching baby. Patient Name: Jennifer Theressa StampsBrittni Herbst MWUXL'KToday's Date: 12/21/2015 Reason for consult: Follow-up assessment;Difficult latch   Maternal Data Does the patient have breastfeeding experience prior to this delivery?: No  Feeding Feeding Type: Formula Nipple Type: Slow - flow  LATCH Score/Interventions       Type of Nipple: Inverted  Comfort (Breast/Nipple): Soft / non-tender           Lactation Tools Discussed/Used Tools: Nipple Shields Nipple shield size: 20   Consult Status Consult Status: Follow-up Date: 12/21/15 Follow-up type: In-patient    Elvera Almario, Diamond NickelLAURA G 12/21/2015, 5:10 AM

## 2015-12-22 ENCOUNTER — Ambulatory Visit: Payer: Self-pay

## 2015-12-22 LAB — COMPREHENSIVE METABOLIC PANEL
ALBUMIN: 2.6 g/dL — AB (ref 3.5–5.0)
ALT: 16 U/L (ref 14–54)
ANION GAP: 6 (ref 5–15)
AST: 25 U/L (ref 15–41)
Alkaline Phosphatase: 117 U/L (ref 38–126)
BUN: 14 mg/dL (ref 6–20)
CHLORIDE: 106 mmol/L (ref 101–111)
CO2: 22 mmol/L (ref 22–32)
CREATININE: 0.65 mg/dL (ref 0.44–1.00)
Calcium: 7.7 mg/dL — ABNORMAL LOW (ref 8.9–10.3)
GFR calc non Af Amer: >60 mL/min (ref >60)
GLUCOSE: 68 mg/dL (ref 65–99)
Potassium: 3.5 mmol/L (ref 3.5–5.1)
SODIUM: 134 mmol/L — AB (ref 135–145)
Total Bilirubin: 0.2 mg/dL — ABNORMAL LOW (ref 0.3–1.2)
Total Protein: 5.6 g/dL — ABNORMAL LOW (ref 6.5–8.1)

## 2015-12-22 LAB — CBC
HCT: 23.5 % — ABNORMAL LOW (ref 36.0–46.0)
HEMOGLOBIN: 7.3 g/dL — AB (ref 12.0–15.0)
MCH: 25 pg — AB (ref 26.0–34.0)
MCHC: 31.1 g/dL (ref 30.0–36.0)
MCV: 80.5 fL (ref 78.0–100.0)
PLATELETS: 186 10*3/uL (ref 150–400)
RBC: 2.92 MIL/uL — AB (ref 3.87–5.11)
RDW: 15.1 % (ref 11.5–15.5)
WBC: 10 10*3/uL (ref 4.0–10.5)

## 2015-12-22 MED ORDER — PANTOPRAZOLE SODIUM 40 MG PO TBEC: 40.0000 mg | DELAYED_RELEASE_TABLET | Freq: Every day | ORAL | Status: DC

## 2015-12-22 MED ORDER — IBUPROFEN 600 MG PO TABS: 600.0000 mg | ORAL_TABLET | Freq: Four times a day (QID) | ORAL | Status: DC

## 2015-12-22 MED ORDER — PANTOPRAZOLE SODIUM 40 MG PO TBEC
40.0000 mg | DELAYED_RELEASE_TABLET | Freq: Every day | ORAL | Status: DC
  Administered 2015-12-22: 40 mg via ORAL
  Filled 2015-12-22: qty 1

## 2015-12-22 NOTE — Discharge Summary (Signed)
OB Discharge Summary     Patient Name: Jennifer Bell DOB: March 11, 1991 MRN: [id number]  Date of admission: 12/19/2015 Delivering MD: Huel Cote   Date of discharge: 12/22/2015  Admitting diagnosis: 39 WK INDUCTION Intrauterine pregnancy: [redacted]w[redacted]d     Secondary diagnosis:  Active Problems:   Antepartum mild preeclampsia   NSVD (normal spontaneous vaginal delivery)  Additional problems: GERD     Discharge diagnosis: Term Pregnancy Delivered                                                                                                Post partum procedures:none  Augmentation: AROM, Pitocin and Cytotec  Complications: None  Hospital course:  Induction of Labor With Vaginal Delivery   25 y.o. yo G1P1001 at [redacted]w[redacted]d was admitted to the hospital 12/19/2015 for induction of labor.  Indication for induction: Preeclampsia.  Patient had an uncomplicated labor course as follows: Membrane Rupture Time/Date: 1:33 PM ,12/19/2015   Intrapartum Procedures: Episiotomy: None [1]                                         Lacerations:  2nd degree [3]  Patient had delivery of a Viable infant.  Information for the patient's newborn:  SHIYA, FOGELMAN [[id number]]  Delivery Method: Vaginal, Spontaneous Delivery (Filed from Delivery Summary)   12/20/2015  Details of delivery can be found in separate delivery note.  Patient had a routine postpartum course. Patient is discharged home 12/22/2015.   Physical exam  Filed Vitals:   12/20/15 1736 12/21/15 0615 12/21/15 1841 12/22/15 0547  BP: 126/56 128/54 129/85 126/77  Pulse: 85 75 83 78  Temp: 98.5 F (36.9 C) 98.3 F (36.8 C) 98.1 F (36.7 C) 98.2 F (36.8 C)  TempSrc: Oral Oral Oral Oral  Resp: Height:      Weight:      SpO2: 97%      General: alert and cooperative Lochia: appropriate Uterine Fundus: firm  Labs: Lab Results  Component Value Date   WBC 16.2* 12/21/2015   HGB 7.5* 12/21/2015   HCT 23.2*  12/21/2015   MCV 79.7 12/21/2015   PLT 183 12/21/2015   CMP Latest Ref Rng 12/19/2015  Glucose 65 - 99 mg/dL 295(A)  BUN 6 - 20 mg/dL 13  Creatinine 2.13 - 0.86 mg/dL 5.78  Sodium 469 - 629 mmol/L 134(L)  Potassium 3.5 - 5.1 mmol/L 3.5  Chloride 101 - 111 mmol/L 108  CO2 22 - 32 mmol/L 18(L)  Calcium 8.9 - 10.3 mg/dL 5.2(W)  Total Protein 6.5 - 8.1 g/dL 6.0(L)  Total Bilirubin 0.3 - 1.2 mg/dL 0.5  Alkaline Phos 38 - 126 U/L 169(H)  AST 15 - 41 U/L 25  ALT 14 - 54 U/L 11(L)    Discharge instruction: per After Visit Summary and "Baby and Me Booklet".  After visit meds:    Medication List    STOP taking these medications        cefdinir 300  MG capsule  Commonly known as:  OMNICEF     omeprazole 40 MG capsule  Commonly known as:  PRILOSEC      TAKE these medications        ibuprofen 600 MG tablet  Commonly known as:  ADVIL,MOTRIN  Take 1 tablet (600 mg total) by mouth every 6 (six) hours.     multivitamin-prenatal 27-0.8 MG Tabs tablet  Take 1 tablet by mouth daily at 12 noon.     pantoprazole 40 MG tablet  Commonly known as:  PROTONIX  Take 1 tablet (40 mg total) by mouth daily.        Diet: routine diet  Activity: Advance as tolerated. Pelvic rest for 6 weeks.   Outpatient follow up:6 weeks Follow up Appt:No future appointments. Follow up Visit:No Follow-up on file.  Postpartum contraception: Undecided  Newborn Data: Live born female  Birth Weight: 8 lb 12.6 oz (3986 g) APGAR: 7, 9  Baby Feeding: Breast Disposition:home with mother   12/22/2015 Oliver PilaICHARDSON,Bryahna Lesko W, MD

## 2015-12-22 NOTE — Progress Notes (Signed)
Post Partum Day 2 Subjective: up ad lib and tolerating PO  C/o some epigastric pain.  Objective: Blood pressure 126/77, pulse 78, temperature 98.2 F (36.8 C), temperature source Oral, resp. rate 20, height 5\' 7"  (1.702 m), weight 89.812 kg (198 lb), last menstrual period 03/20/2015, SpO2 97 %, unknown if currently breastfeeding.  Physical Exam:  General: alert and cooperative Lochia: appropriate Uterine Fundus: firm    Recent Labs  12/20/15 0846 12/21/15 0519  HGB 8.7* 7.5*  HCT 27.3* 23.2*    Assessment/Plan: Discharge home if labs WNL BP totally WNL, suspect epigastric pain from reflux as pt has not been taking her reflux medications, will ensure LFT's WNL Prior to d/c.   LOS: 3 days   Jennifer Bell W 12/22/2015, 8:43 AM

## 2015-12-22 NOTE — Clinical Social Work Maternal (Signed)
CLINICAL SOCIAL WORK MATERNAL/CHILD NOTE  Patient Details  Name: Jennifer Bell MRN: [id number] Date of Birth: 1991/06/05  Date:  12/22/2015  Clinical Social Worker Initiating Note:  Loleta Books MSW, LCSW Date/ Time Initiated:  12/22/15/1115     Child's Name:  Jennifer Bell   Legal Guardian:  Gionni and Norvel Richards  Need for Interpreter:  None   Date of Referral:  12/20/15     Reason for Referral:  Other (Comment)   Referral Source:  [facility]   Address:  [address] [place], [place] [address]  Phone number:  [phone]   Household Members:  Spouse   Natural Supports (not living in the home):  Extended Family, Immediate Family, Friends   Radiographer, therapeutic Supports: None   Employment: Full-time   Type of Work: Runner, broadcasting/film/video   Education:  IT sales professional Resources:  Media planner   Other Resources:    None identified   Cultural/Religious Considerations Which May Impact Care:  None reported  Strengths:  Ability to meet basic needs , Pediatrician chosen , Home prepared for child    Risk Factors/Current Problems:   1. Mental Health Concerns -- MOB presents with a history of anxiety. MOB identified presence of anxious thought processes since the infant has been born.   Cognitive State:  Able to Concentrate , Alert , Goal Oriented , Linear Thinking , Insightful    Mood/Affect:  Bright , Happy , Comfortable , Calm    CSW Assessment:  CSW received request for consult due to MOB presenting with a history of anxiety.  MOB provided consent for FOB to remain in the room during the assessment.  MOB presented as easily engaged and receptive to the visit. MOB required minimal time to establish rapport, displayed a full range in affect, and presented in a pleasant mood.  MOB presented with insight and self-awareness in regards to her mental health, and recognized the importance of closely monitoring for the impact of her anxieties.   MOB openly  processed her thoughts and feelings secondary to her childbirth experience, breastfeeding, and her transition postpartum.  MOB discussed a more pain childbirth than she anticipated due to poor pain management, but she immediately began discussing how it was okay since "he's worth it" (referring to the infant).  MOB identified breastfeeding as a challenge, but stated that she has been learning a lot, and feels as though progress is being made. She expressed appreciation for the help that she has received, and expressed confidence that breastfeeding is going in a positive direction.  MOB shared that breastfeeding is important to her, but stated that she wants to ensure that the infant is also being fed.  Per MOB, she is okay with supplementing to ensure adequate caloric intake for the infant. She also recognized that she is not personally responsible if breastfeeding is difficult since the infant's latch also impacts feedings.   MOB reported that her mother was never able to breastfeed, so she knows that it is a potential outcome.  MOB shared that in the event that she is unable to breastfeed, she will be "okay" since she knows that she is currently trying to do everything within her control to breastfeed.    MOB endorsed presence of anxiety since the infant has been born. She stated that she is hesitant to allow other people to hold the infant out of fear that they will not be gentle enough and out of fear that they did not properly wash their hands  first.  MOB also stated that she has noted that she will closely watch him while he is sleeping and check to see if he is breathing if she thinks that he has been quiet for too long.  MOB recognizes these thoughts as anxieties, but described them to be present as she continues to adjust postpartum.  MOB presented with insight on need to closely monitor her anxieties since if they continue, it may make it difficult for her to engage in activities of daily living once she  is at home.   MOB confirmed history of anxiety pre-pregnancy (and was noted in her medical record since prior to 2014). MOB's comments highlighted baseline anxieties. She stated that she has previously been prescribed medication, but has never filled the script.  MOB denied changes in her mental health with the onset of the pregnancy.  MOB was receptive to the education on the baby blues and perinatal mood disorders. MOB stated that she is aware of her increase risk, and agreed to closely monitor for signs and symptoms. MOB acknowledged the normative baby blue period, and shared that she feels better knowing that perinatal mood and anxiety disorders are common and treatable.  MOB was receptive to identifying indicators that it may be time to address anxieties with a medical professional.  CSW informed MOB of evidence based treatments, and strategies that can be implemented as she transitions home.  MOB recognized the importance of sleep, and shared that she intends to figure out how to incorporate adequate sleep into her routine.   MOB denied questions, concerns, or needs at this time. She expressed appreciation for the visit, and agreed to contact CSW if needs arise prior to discharge.  CSW provided MOB with additional educational handout and online resources for perinatal mood and anxiety disorders.  CSW Plan/Description:   1. Patient/Family Education-- perinatal mood and anxiety disorders 2. No Further Intervention Required/No Barriers to Discharge    Kelby FamVenning, Declan Mier N, LCSW 12/22/2015, 12:16 PM

## 2015-12-22 NOTE — Lactation Note (Signed)
This note was copied from a baby's chart. Lactation Consultation Note Follow up visit at 61 hours of age.  Mom reports baby is latched not but not sucking well.  She does see milk in NS.  Mom reports most feedings are with formula and she is worried about her milk supply. Mom reports she is pumping regularly.  Encouraged mom to keep pumping and milk may take a few days to come to volume.  Baby will have jaundice check tonight and mom is concerned about that. LC enoocuaged mom to offer formula volumes as tolerated by baby and to increase in small amounts.  Baby has had adequate output in past 24 hours.  LC answered questions and discussed basics of breastfeeding and offered support. Mom to call for assist as needed.    Patient Name: Jennifer Theressa StampsBrittni Aime FAOZH'YToday's Date: 12/22/2015 Reason for consult: Follow-up assessment   Maternal Data Has patient been taught Hand Expression?: Yes  Feeding Feeding Type: Breast Fed Length of feed:  (whole feeding not observed)  LATCH Score/Interventions                      Lactation Tools Discussed/Used Tools: Nipple Shields   Consult Status Consult Status: Follow-up Date: 12/23/15 Follow-up type: In-patient    Beverely RisenShoptaw, Arvella MerlesJana Lynn 12/22/2015, 9:41 PM

## 2015-12-23 ENCOUNTER — Ambulatory Visit: Payer: Self-pay

## 2015-12-23 NOTE — Lactation Note (Signed)
This note was copied from a baby's chart. Lactation Consultation Note  Baby has been latching using a nipple shield with an SNS behind it. Parents report hearing air leaks.  Positioned tube on the outside of the NS and the leak was not present.  [name] is not pulling the supplement on his own so dad is pushing it. Changed baby to a double SNS as he will need it at home.  Mom reports that her breasts are changing.  [name] is not elevating his tongue to the roof of his mouth, has an anterior bubble palate and does not maintain strong suction.  His gag reflex is sensitive though with oral exercises he allowed a gloved finger deeper into his mouth. The hard and soft palate juncture were not felt at this assessment.  He has difficulty extending his tongue when his mouth is wide open. He lateralizes well. Parents encouraged to perform tongue exercises to help with function.  Goal for now is to bring milk to volume and to feed the baby.  Outpatient appointment scheduled for next week. Patient Name: Jennifer Bell IONGE'XToday's Date: 12/23/2015 Reason for consult: Follow-up assessment   Maternal Data    Feeding Feeding Type: Breast Fed  [facility]                      Lactation Tools Discussed/Used     Consult Status      Jennifer Bell, Jennifer Bell 12/23/2015, 10:40 AM

## 2015-12-23 NOTE — Lactation Note (Signed)
This note was copied from a baby's chart. Lactation Consultation Note  Post revision feeding assessment. [name] needed a little encouragement to start suckling but for the most part transferred from the SNS independently. Longer jaw excursions were noted and mother reported increased strength of suckle. Patient Name: Jennifer Bell HYQMV'HToday's Date: 12/23/2015 Reason for consult: Follow-up assessment   Maternal Data    Feeding Feeding Type: Breast Milk with Formula added  LATCH Score/Interventions Latch: Repeated attempts needed to sustain latch, nipple held in mouth throughout feeding, stimulation needed to elicit sucking reflex.  Audible Swallowing: A few with stimulation  Type of Nipple: Flat  Comfort (Breast/Nipple): Soft / non-tender     Hold (Positioning): Assistance needed to correctly position infant at breast and maintain latch.  LATCH Score: 6  Lactation Tools Discussed/Used Tools: Nipple Shields Nipple shield size: 24   Consult Status      Jennifer Bell, Jennifer Bell 12/23/2015, 1:03 PM

## 2015-12-29 ENCOUNTER — Ambulatory Visit (HOSPITAL_COMMUNITY)
Admission: RE | Admit: 2015-12-29 | Discharge: 2015-12-29 | Disposition: A | Discharge status: Home / Self Care | Payer: BC Managed Care – PPO | Source: Ambulatory Visit | Attending: Obstetrics and Gynecology | Admitting: Obstetrics and Gynecology

## 2015-12-29 NOTE — Lactation Note (Signed)
Lactation Consult  Mother's reason for visit:  Mother states that infant is not getting enough when he breastfeeds and likes the bottle best.  Visit Type:  Feeding assessment  Appointment Notes:  Mother states that when she got home she started bottle feeding.  Consult:  Initial Lactation Consultant:  Michel BickersKendrick, Jasten Guyette McCoy  ________________________________________________________________________    ________________________________________________________________________  Mother's Name: Jennifer Bell Type of delivery:  vaginal del  Breastfeeding Experience:  none Maternal Medical Conditions:  Pregnancy induced hypertension Maternal Medications:  Prenatal vits motrin, prilosec  ________________________________________________________________________  Breastfeeding History (Post Discharge)  Frequency of breastfeeding:  4 times daily Duration of feeding:  5 mins each feeding then infant get frustrated  Supplement  Breastmilk:  2-3 ounces  Frequency:  Every 2-3 hours    Method:  Bottle,   Pumping  Type of pump:  Medela pump in style Frequency:  10-12 times  Volume:  2-3 hours  Infant Intake and Output Assessment  Voids:13 in 24 hrs.  Color:  Clear yellow Stools:  11  in 24 hrs.  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 Infant chomps and chews at the breast. Mother taught breast compression for better transfer of milk.  Mother has worked very hard on milk supply. Infant would benefit from a posterior tongue revision. I did recommend an evaluation from Dr Cooper RenderBrian McMurtry [place].  Positioning:  Cross cradle Left  breast  LATCH documentation:  Latch:  2 = Grasps breast easily, tongue down, lips flanged, rhythmical sucking.  Audible swallowing:  2 = Spontaneous and intermittent  Type of nipple:  2 = Everted at rest and after stimulation  Comfort (Breast/Nipple):  1 = Filling, red/small blisters or bruises, mild/mod discomfort  Hold (Positioning):  1 = Assistance needed to correctly position infant at breast and maintain latch  LATCH score:  8  Attached assessment:  Deep  Lips flanged:  Yes.    Lips untucked:  Yes.    Suck assessment:  Displays both  Tools:  Nipple shield 20 mm Instructed on use and cleaning of tool:  Yes.    Pre-feed weight: 3984,8-12.5   Post-feed weight:  1610,9-60.44014,8-13.6 Amount transferred: 30 ml   Additional Feeding Assessment -   Infant's oral assessment:  Variance  Positioning:  Cross cradle Right breast  LATCH documentation:  Latch:  2 = Grasps breast easily, tongue down, lips flanged, rhythmical sucking.  Audible swallowing:  2 = Spontaneous and intermittent  Type of nipple:  2 = Everted at rest and after stimulation  Comfort (Breast/Nipple):  1 = Filling, red/small blisters or bruises, mild/mod discomfort  Hold (Positioning):  1 = Assistance needed to correctly position infant at breast and maintain latch  LATCH score:  8  Attached assessment:  Deep  Lips flanged:  Yes.    Lips untucked:  Yes.    Suck assessment:  Displays both  Tools:  Nipple shield 20 mm Instructed on use and cleaning of tool:  Yes.    Pre-feed weight: 4014 Post-feed weight:  4034 Amount transferred:  20 ml Amount supplemented:  60 ml    Total amount transferred: 50 ml Total supplement given:60 ml  Advised mother to continue to  offer breast to infant with or without the nipple shield  Post pump for 15-20 mins after each feeding 6-8 times daily   Mother to pump last thing before bed and then after the morning feeding in the am then every 2-3 hours during the day.  Advised  mother to nap frequently Follow up in 1-2 weeks with Lc for feeding assessment Follow up with Peds on April 5  Lots of praise and support given to mother

## 2016-02-20 IMAGING — US US RENAL
1 series · 16 of 25 positions shown · non-contrast
Comparison: None.

CLINICAL DATA: Patient with left lower quadrant pain.

EXAM:
RENAL / URINARY TRACT ULTRASOUND COMPLETE

[Series 1: us renal · 36 acquisitions, 16 frames shown]
[im 1/36]
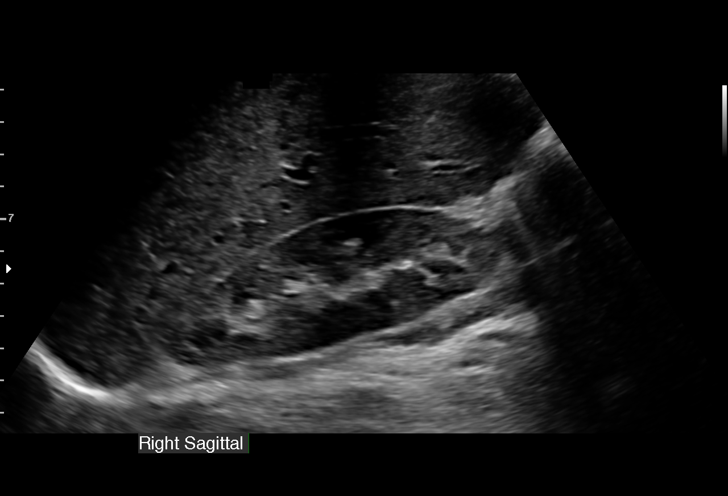
[im 3/36]
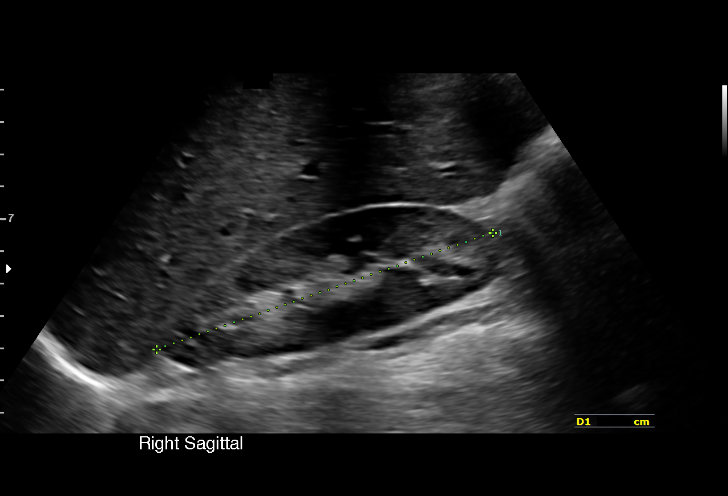
[im 5/36]
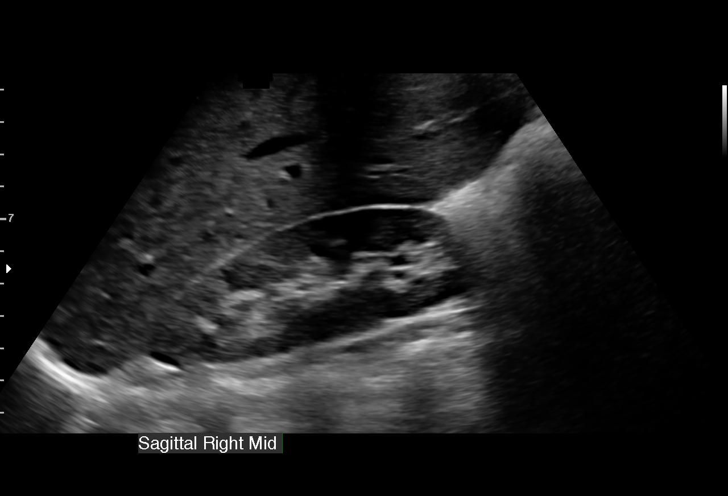
[im 8/36]
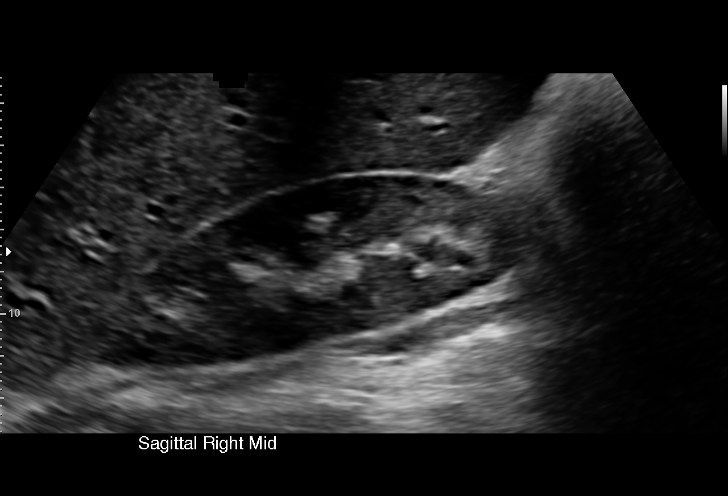
[im 11/36]
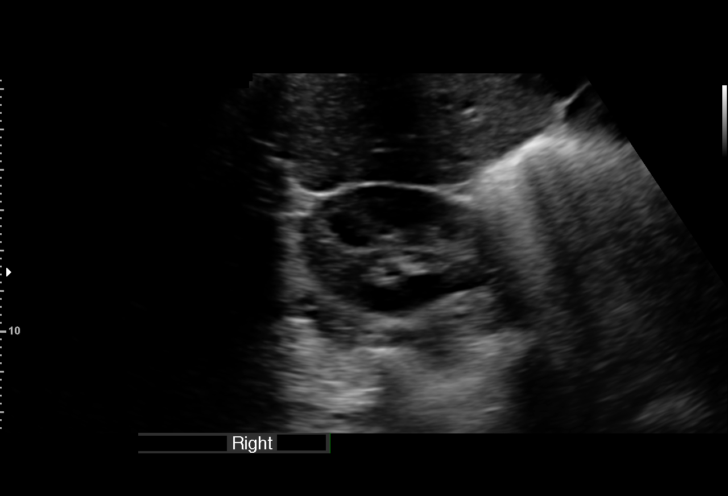
[im 12/36]
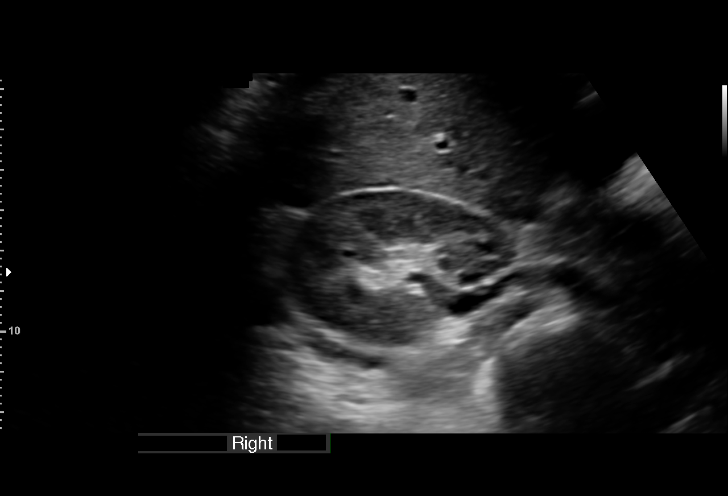
[im 15/36]
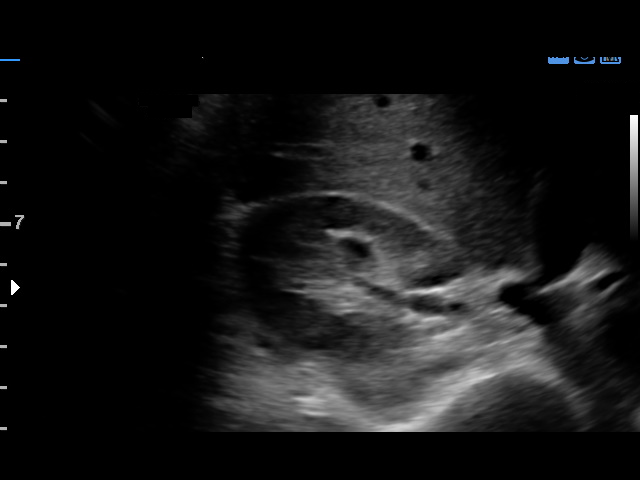
[im 17/36]
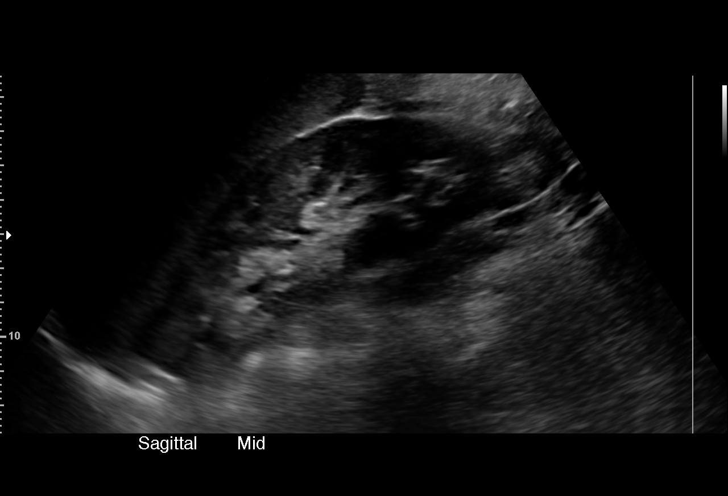
[im 19/36]
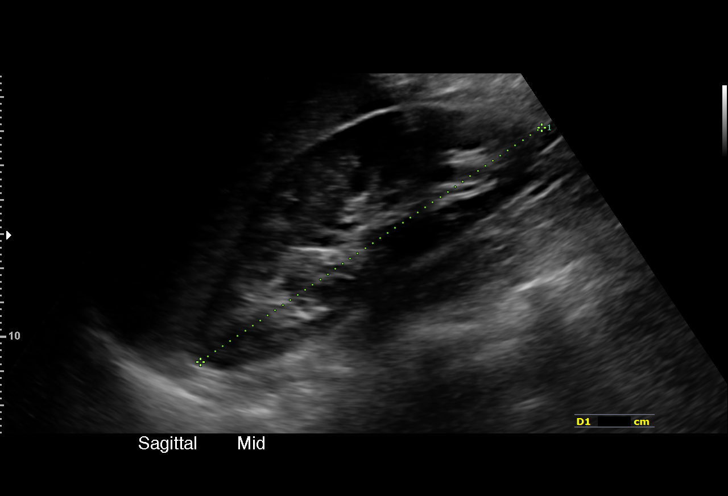
[im 21/36]
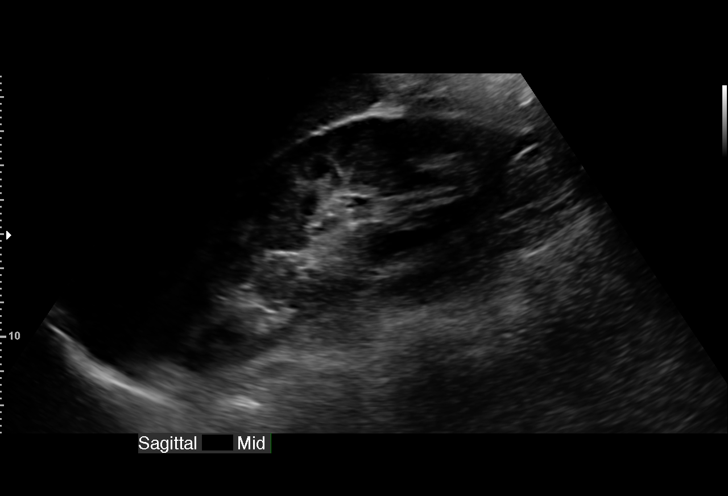
[im 24/36]
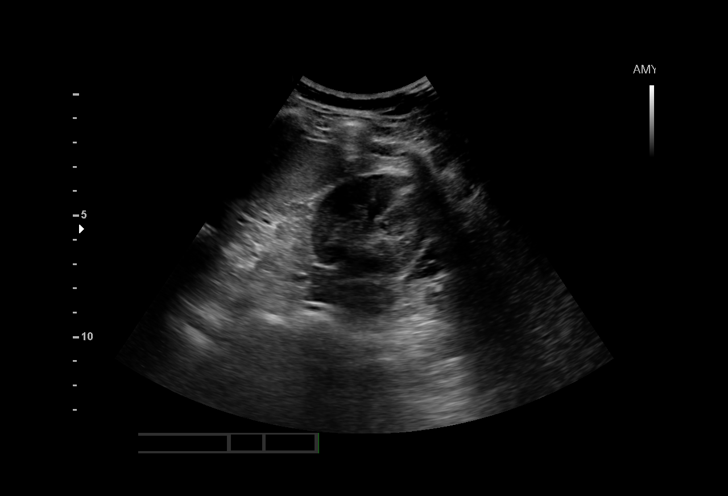
[im 25/36]
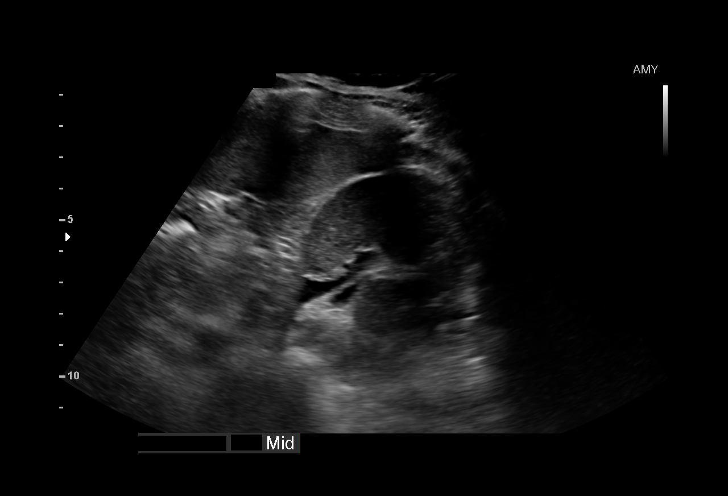
[im 28/36]
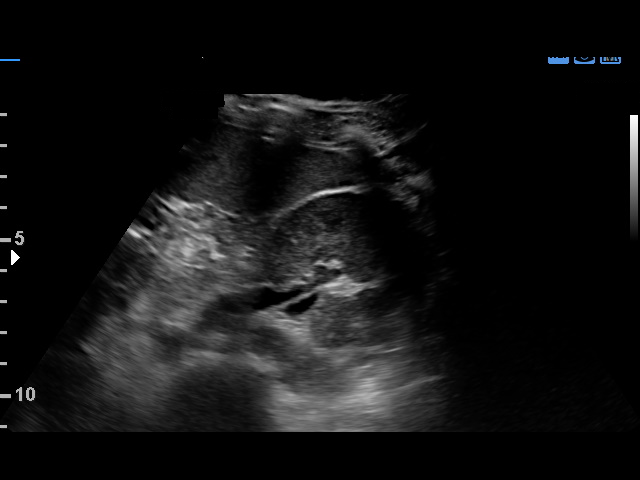
[im 31/36]
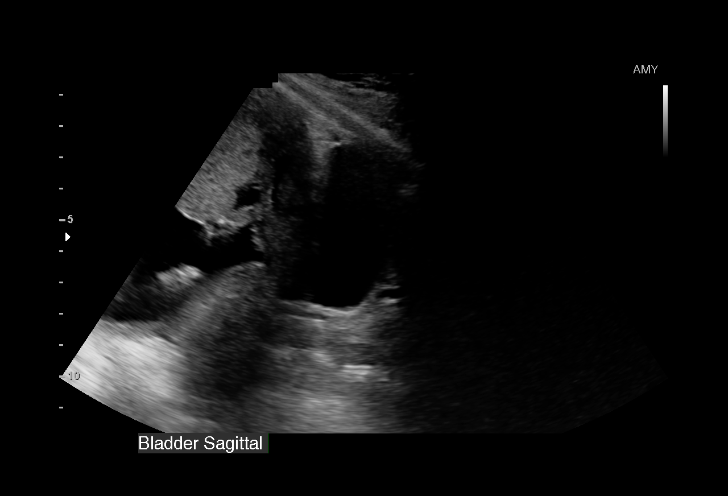
[im 33/36]
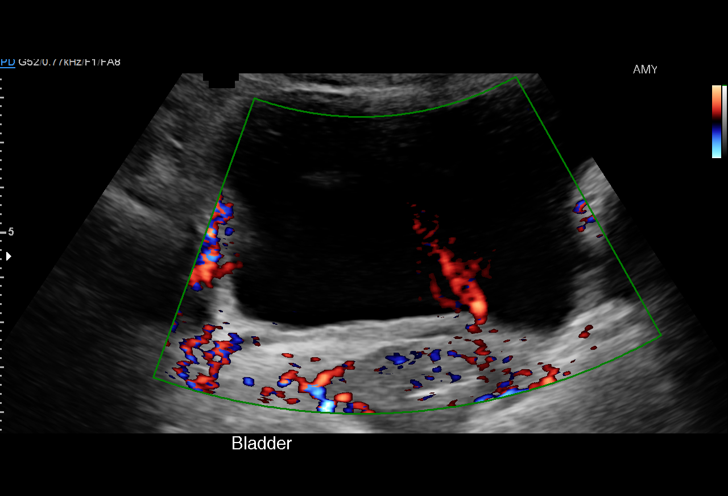
[im 36/36]
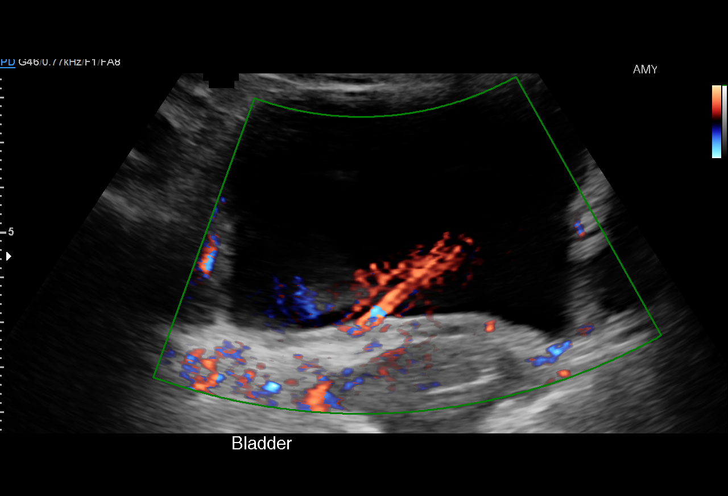

[16 of 25 positions shown; findings below may reference images not displayed]

FINDINGS: Right Kidney:

Length: 10.9 cm. Echogenicity within normal limits. No mass or
hydronephrosis visualized.

Left Kidney:

Length: 12.1 cm. Echogenicity within normal limits. No mass or
hydronephrosis visualized.

Bladder:

Appears normal for degree of bladder distention.
IMPRESSION: No hydronephrosis.

## 2016-02-20 IMAGING — US US MFM OB LIMITED
1 series · 15 of 20 positions shown · non-contrast
Comparison: none

[Series 1: us mfm ob limited · 15 of 20 slices shown]
[im 1/20]
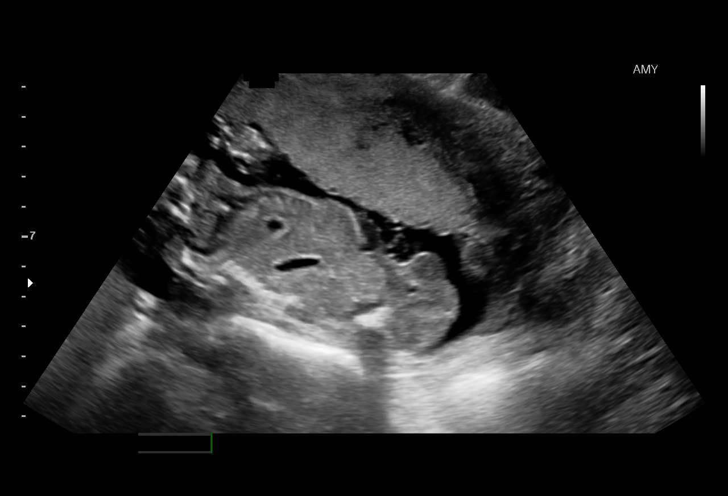
[im 3/20]
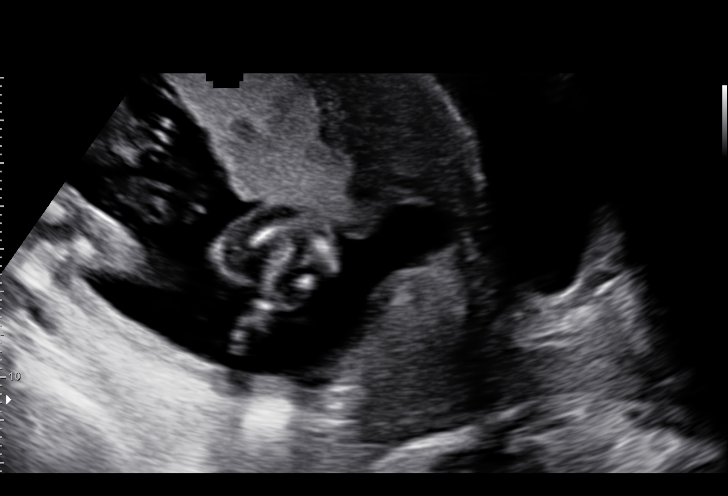
[im 4/20]
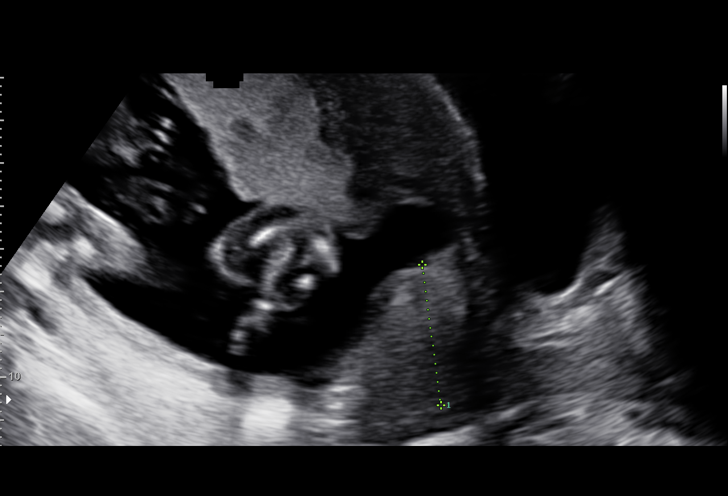
[im 5/20]
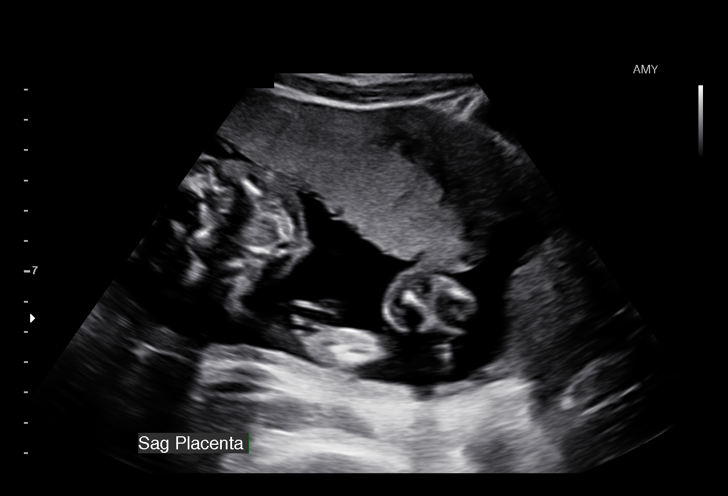
[im 7/20]
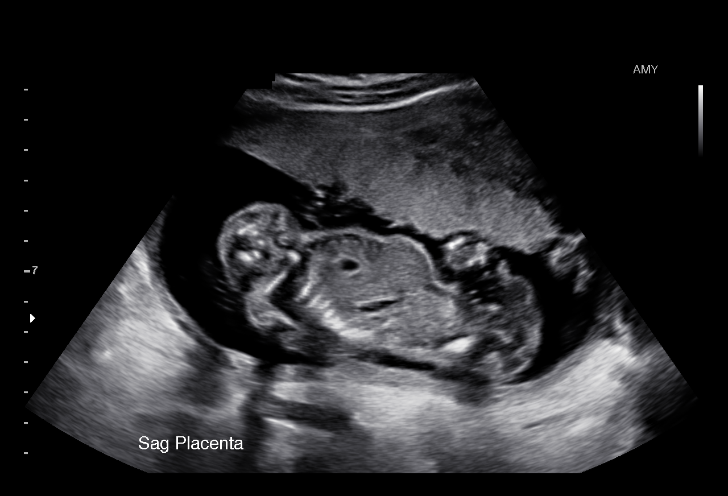
[im 8/20]
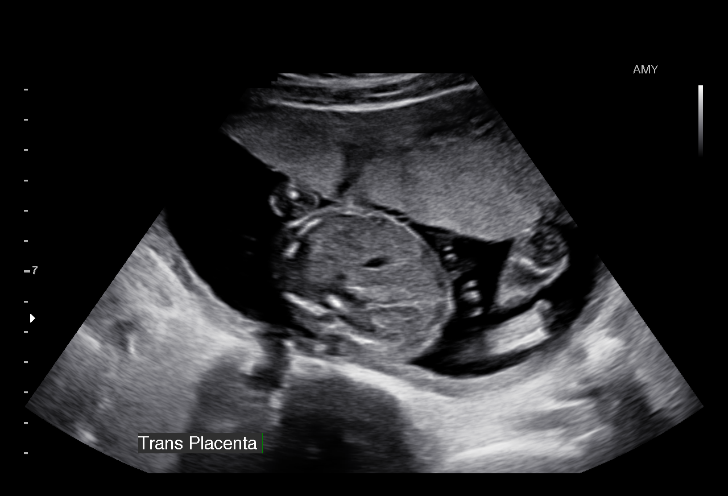
[im 9/20]
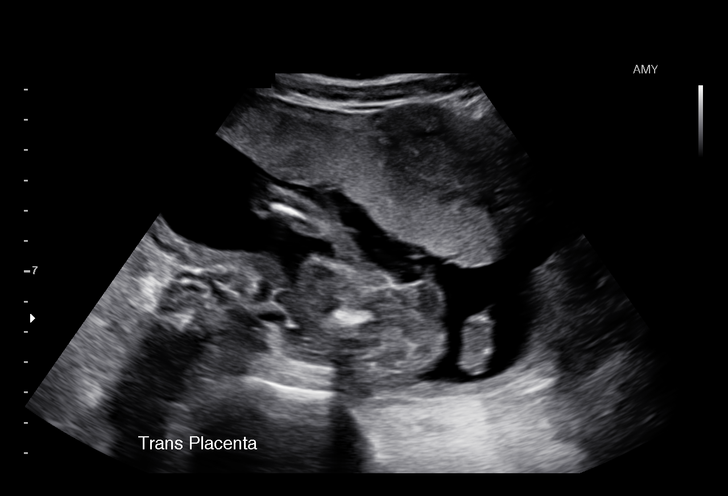
[im 11/20]
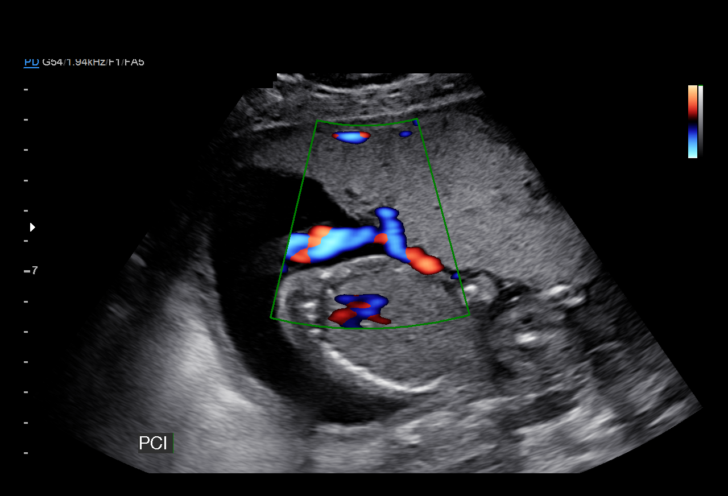
[im 12/20]
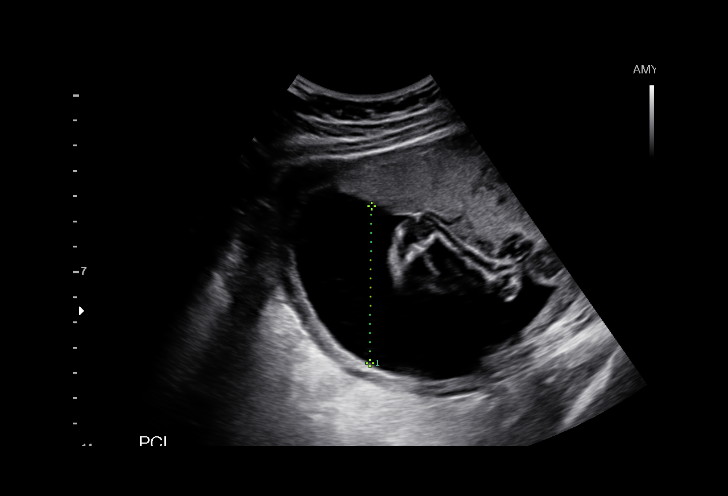
[im 13/20]
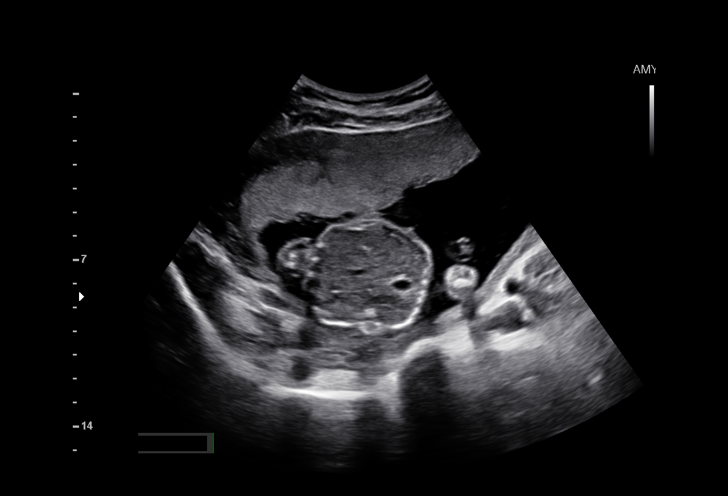
[im 15/20]
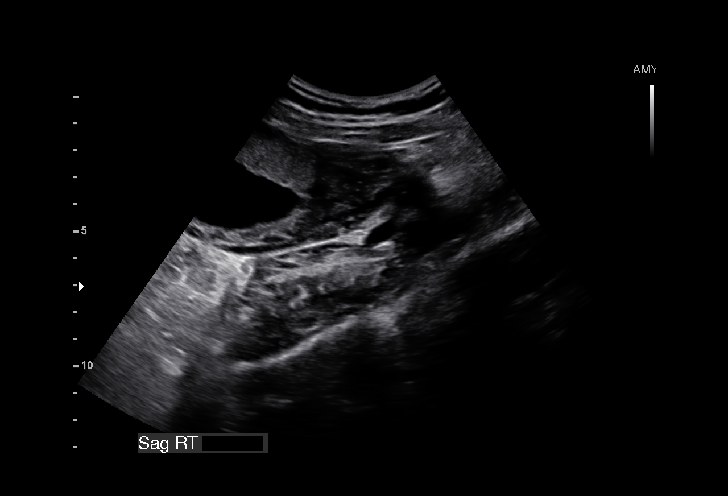
[im 16/20]
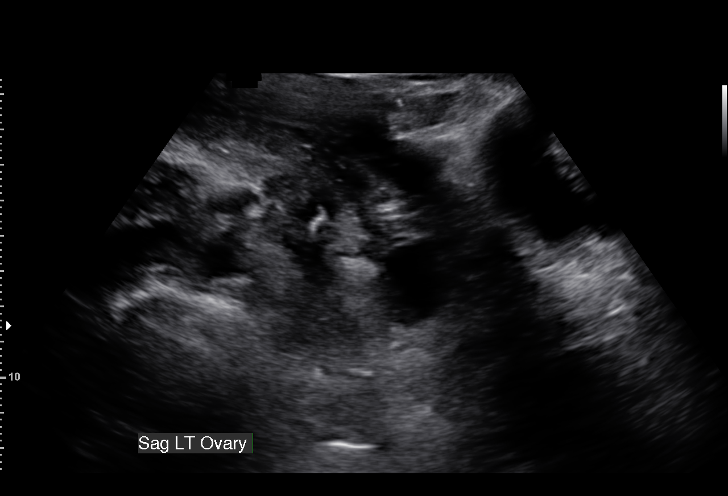
[im 17/20]
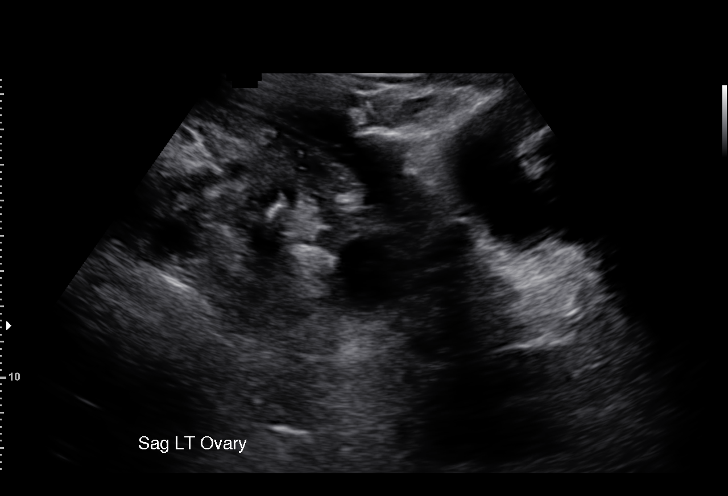
[im 19/20]
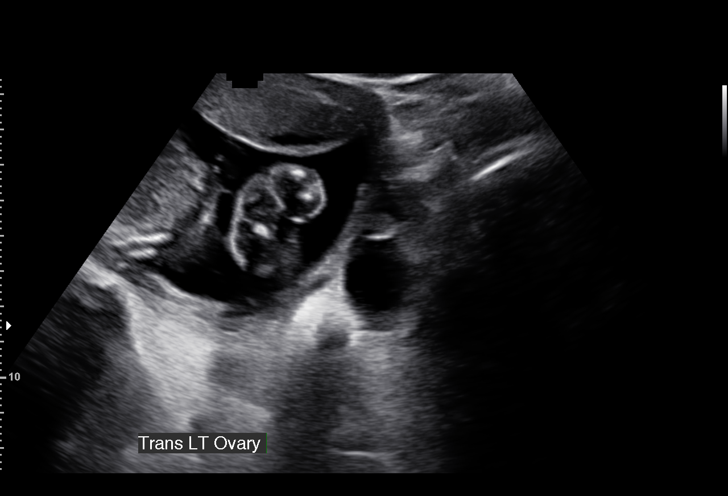
[im 20/20]
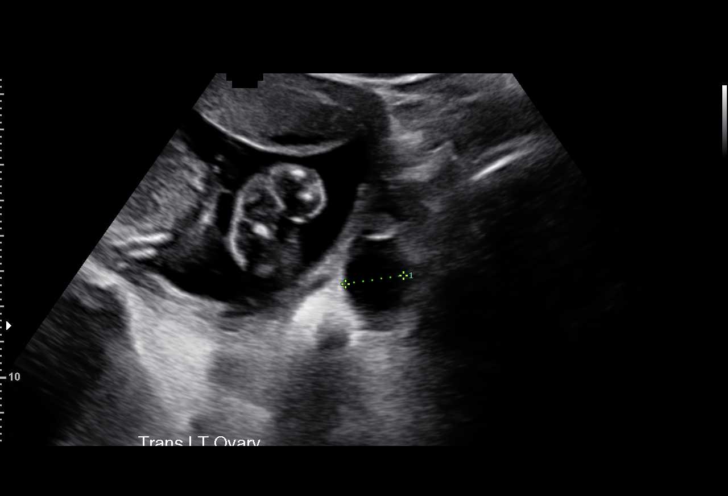

[15 of 20 positions shown; findings below may reference images not displayed]

OBSTETRICS REPORT
(Signed Final 08/12/2015 [DATE])

Name:       YULIETH ANDREA LUISA                    Visit  08/12/2015 [DATE]
Date:

Service(s) Provided

US MFM OB LIMITED                                      76815.01
Indications

Abdominal pain in pregnancy
20 weeks gestation of pregnancy
Fetal Evaluation

Num Of             1
Fetuses:
Fetal Heart        153                          bpm
Rate:
Cardiac Activity:  Observed
Presentation:      Breech
Placenta:          Anterior, above cervical
os
P. Cord            Visualized
Insertion:

Amniotic Fluid
AFI FV:      Subjectively within normal limits
Larg Pckt:      6.2  cm
Gestational Age

LMP:           20w 5d        Date:  03/20/15                  EDD:   12/25/15
Best:          20w 5d    Det. By:   LMP  (03/20/15)           EDD:   12/25/15
Cervix Uterus Adnexa

Cervical Length:    3.3       cm

Cervix:       Normal appearance by transabdominal scan.

Left Ovary:    Simple cyst measuring.  2.1 x 1.4 x 1.7cm
Right Ovary:   Within normal limits.
Impression

Single IUP at 20w 5d
Limited ultrasound performed due to abdominal pain
Breech presentation
Anterior placenta without previa- no subchorionic fluid
collections noted
Normal cervical length (transabdominal)
A simple 2.1 x 1.4 x 1.7 cm left ovarian cyst is noted
Normal amniotic fluid volume
Recommendations

Follow-up ultrasounds as clinically indicated.

## 2016-05-05 ENCOUNTER — Encounter: Payer: Self-pay | Admitting: Gastroenterology

## 2016-07-19 ENCOUNTER — Ambulatory Visit (INDEPENDENT_AMBULATORY_CARE_PROVIDER_SITE_OTHER): Payer: BC Managed Care – PPO | Admitting: Osteopathic Medicine

## 2016-07-19 VITALS — BP 123/62 | HR 64 | Wt 146.0 lb

## 2016-07-19 DIAGNOSIS — J069 Acute upper respiratory infection, unspecified: Secondary | ICD-10-CM

## 2016-07-19 DIAGNOSIS — B9789 Other viral agents as the cause of diseases classified elsewhere: Secondary | ICD-10-CM | POA: Diagnosis not present

## 2016-07-19 DIAGNOSIS — F411 Generalized anxiety disorder: Secondary | ICD-10-CM | POA: Diagnosis not present

## 2016-07-19 MED ORDER — IPRATROPIUM BROMIDE 0.03 % NA SOLN: 2.0000 | Freq: Four times a day (QID) | NASAL | 0 refills | Status: DC | PRN

## 2016-07-19 MED ORDER — BENZONATATE 200 MG PO CAPS: 200.0000 mg | ORAL_CAPSULE | Freq: Three times a day (TID) | ORAL | 0 refills | Status: DC | PRN

## 2016-07-19 MED ORDER — ESCITALOPRAM OXALATE 10 MG PO TABS: ORAL_TABLET | ORAL | 0 refills | Status: DC

## 2016-07-19 NOTE — Patient Instructions (Addendum)
Anxiety:  We are starting a medication called Lexapro. Typically the effective dose for this is 10 mg, start with one half tablet daily (5 mg) for 2 weeks and then can increase the dose to whole tablet (10 mg) if you are tolerating this medicine. Please let us know ASAP if you're having any concerning side effects or questions about this medication or about her symptoms.  We have also placed a referral to behavioral health for counseling. Please let us know if you are unable to get into see them in a timely manner.    Upper respiratory infection:  Most likely your symptoms are due to viral upper respiratory illness causing postnasal drip, sore throat, cough, sinus congestion. The common cold is not something that we can cure, but we can help control symptoms while your body fights the infection.   You've been given prescription for nasal spray to help with sinus drainage, plus a prescription for cough medicine. Other over-the-counter remedies which are typically helpful include taking all of the following together: Ibuprofen, Tylenol, decongestants such as Sudafed, antihistamine such as Benadryl. Use caution, many generics are sold in cold/flu formulations as combination, always ask a pharmacist if you are concerned about any medication interactions or duplications.   Look for lozenges which contain menthol plus benzocaine, these will help numb the throat and prevent cough. Be sure you're staying well hydrated with plenty of water or warm tea with honey.   Most people feel better from a viral upper respiratory infection in 7-10 days, though cough symptoms can linger for several weeks. Please let us know if you're not getting better or if you get worse.

## 2016-07-19 NOTE — Progress Notes (Signed)
HPI: Jennifer Bell is a 25 y.o. female  who presents to [facility] today, 07/19/16,  for chief complaint of:  Chief Complaint  Patient presents with  . Anxiety    Anxiety, new problem: Patient has previously struggled with anxiety issues but is finding it very difficult to cope with work as well as family responsibilities with young infant. Significant mood issues, crying much more often, difficulty coping. Noticing that it is impacting her professional and personal life. Has good support from her husband but he is also struggling with workload. Good support from mom. Never been on medications in the past. Grandmother did not do well on Prozac. No history of manic symptoms/bipolar issues. Patient is not breast-feeding, no plans to become pregnant again for at least another year  Sinuses, acute problem: DayQuil and NyQuil, headache pressure, (+)nasal drainage, sore throat, dry coughing, over the past 4-5 days.  .   Past medical, surgical, social and family history reviewed: Past Medical History:  Diagnosis Date  . Anxiety   . Barrett esophagus   . GERD (gastroesophageal reflux disease)   . Heart palpitations   . Hiatal hernia    Past Surgical History:  Procedure Laterality Date  . MYRINGOTOMY WITH TUBE PLACEMENT    . TONSILLECTOMY    . WISDOM TOOTH EXTRACTION     Social History  Substance Use Topics  . Smoking status: Never Smoker  . Smokeless tobacco: Never Used  . Alcohol use No   Family History  Problem Relation Age of Onset  . Vitamin D deficiency Mother   . Non-Hodgkin's lymphoma Paternal Grandfather   . Depression Paternal Aunt   . Diabetes Maternal Grandmother   . Diabetes Maternal Grandfather   . Lung cancer Maternal Grandfather      Current medication list and allergy/intolerance information reviewed:   Current Outpatient Prescriptions  Medication Sig Dispense Refill  . ibuprofen (ADVIL,MOTRIN) 600 MG tablet Take 1  tablet (600 mg total) by mouth every 6 (six) hours. 30 tablet 0  . pantoprazole (PROTONIX) 40 MG tablet Take 1 tablet (40 mg total) by mouth daily. 30 tablet 1   No current facility-administered medications for this visit.    Allergies  Allergen Reactions  . Augmentin [Amoxicillin-Pot Clavulanate] Other (See Comments)    Abdominal Pain      Review of Systems:  Constitutional:  No  fever, no chills, +recent illness, No unintentional weight changes. +significant fatigue.   HEENT: No  headache, no vision change, no hearing change, +sore throat, +sinus pressure  Cardiac: No  chest pain, No  pressure, No palpitations, No  Orthopnea  Respiratory:  No  shortness of breath. +Cough  Gastrointestinal: No  abdominal pain, No  nausea  Skin: No  Rash  Neurologic: No  weakness, No  dizziness  Psychiatric: No  concerns with depression, +concerns with anxiety, No sleep problems, +mood problems  Exam:  BP 123/62   Pulse 64   Wt 146 lb (66.2 kg)   BMI 22.87 kg/m   Constitutional: VS see above. General Appearance: alert, well-developed, well-nourished, NAD  Eyes: Normal lids and conjunctive, non-icteric sclera  Ears, Nose, Mouth, Throat: MMM, Normal external inspection ears/nares/mouth/lips/gums. TM normal bilaterally. Pharynx/tonsils no erythema, no exudate. Nasal mucosa normal.   Neck: No masses, trachea midline. No thyroid enlargement. No tenderness/mass appreciated. No lymphadenopathy  Respiratory: Normal respiratory effort. no wheeze, no rhonchi, no rales  Cardiovascular: S1/S2 normal, no murmur, no rub/gallop auscultated. RRR.  Musculoskeletal: Gait normal.  Neurological: Normal balance/coordination. No tremor.   Psychiatric: Normal judgment/insight. Depressed/tearful mood and affect. Oriented x3.    GAD 7 : Generalized Anxiety Score 07/19/2016  Nervous, Anxious, on Edge 3  Control/stop worrying 3  Worry too much - different things 3  Trouble relaxing 3  Restless 3   Easily annoyed or irritable 1  Afraid - awful might happen 1  Total GAD 7 Score 17  Anxiety Difficulty Somewhat difficult    Depression screen PHQ 2/9 07/19/2016  Decreased Interest 1  Down, Depressed, Hopeless 1  PHQ - 2 Score 2  Altered sleeping 1  Tired, decreased energy 2  Change in appetite 1  Feeling bad or failure about yourself  3  Trouble concentrating 2  Moving slowly or fidgety/restless 1  Suicidal thoughts 0  PHQ-9 Score 12     ASSESSMENT/PLAN:   Anxiety state - Plan: escitalopram (LEXAPRO) 10 MG tablet, Ambulatory referral to Psychology  Viral URI with cough - Plan: ipratropium (ATROVENT) 0.03 % nasal spray, benzonatate (TESSALON) 200 MG capsule    Patient Instructions  Anxiety:  We are starting a medication called Lexapro. Typically the effective dose for this is 10 mg, start with one half tablet daily (5 mg) for 2 weeks and then can increase the dose to whole tablet (10 mg) if you are tolerating this medicine. Please let us know ASAP if you're having any concerning side effects or questions about this medication or about her symptoms.  We have also placed a referral to behavioral health for counseling. Please let us know if you are unable to get into see them in a timely manner.    Upper respiratory infection:  Most likely your symptoms are due to viral upper respiratory illness causing postnasal drip, sore throat, cough, sinus congestion. The common cold is not something that we can cure, but we can help control symptoms while your body fights the infection.   You've been given prescription for nasal spray to help with sinus drainage, plus a prescription for cough medicine. Other over-the-counter remedies which are typically helpful include taking all of the following together: Ibuprofen, Tylenol, decongestants such as Sudafed, antihistamine such as Benadryl. Use caution, many generics are sold in cold/flu formulations as combination, always ask a pharmacist  if you are concerned about any medication interactions or duplications.   Look for lozenges which contain menthol plus benzocaine, these will help numb the throat and prevent cough. Be sure you're staying well hydrated with plenty of water or warm tea with honey.   Most people feel better from a viral upper respiratory infection in 7-10 days, though cough symptoms can linger for several weeks. Please let us know if you're not getting better or if you get worse.     Visit summary with medication list and pertinent instructions was printed for patient to review. All questions at time of visit were answered - patient instructed to contact office with any additional concerns. ER/RTC precautions were reviewed with the patient. Follow-up plan: Return in about 6 weeks (around 08/30/2016) for medication followup for anxiety.  Note: Total time spent 25 minutes, greater than 50% of the visit was spent face-to-face counseling and coordinating care for the following: The primary encounter diagnosis was Anxiety state. A diagnosis of Viral URI with cough was also pertinent to this visit..Marland Kitchen

## 2016-08-02 ENCOUNTER — Telehealth: Payer: Self-pay | Admitting: Osteopathic Medicine

## 2016-08-02 NOTE — Telephone Encounter (Signed)
Initiated phone call for 2-week follow-up on anxiety issues. Phone was answered, identified myself as Dr. Lyn HollingsheadAlexander wanted to speak to Ms Jennifer Bell, line went dead. Will try again later

## 2016-08-02 NOTE — Telephone Encounter (Signed)
-----   Message from Sunnie NielsenNatalie Dia Jefferys, DO sent at 07/19/2016 12:17 PM EDT ----- Regarding: call for anx/dep Call!

## 2016-08-11 ENCOUNTER — Ambulatory Visit (INDEPENDENT_AMBULATORY_CARE_PROVIDER_SITE_OTHER): Payer: BC Managed Care – PPO | Admitting: Licensed Clinical Social Worker

## 2016-08-11 DIAGNOSIS — F53 Postpartum depression: Secondary | ICD-10-CM

## 2016-08-11 DIAGNOSIS — F411 Generalized anxiety disorder: Secondary | ICD-10-CM

## 2016-08-11 DIAGNOSIS — O99345 Other mental disorders complicating the puerperium: Principal | ICD-10-CM

## 2016-08-15 ENCOUNTER — Encounter (HOSPITAL_COMMUNITY): Payer: Self-pay | Admitting: Licensed Clinical Social Worker

## 2016-08-15 DIAGNOSIS — F411 Generalized anxiety disorder: Secondary | ICD-10-CM | POA: Insufficient documentation

## 2016-08-15 DIAGNOSIS — O99345 Other mental disorders complicating the puerperium: Principal | ICD-10-CM

## 2016-08-15 DIAGNOSIS — F53 Postpartum depression: Secondary | ICD-10-CM | POA: Insufficient documentation

## 2016-08-15 NOTE — Progress Notes (Signed)
Comprehensive Clinical Assessment (CCA) Note  08/15/2016 Jennifer Bell [id number]  Visit Diagnosis:      ICD-9-CM ICD-10-CM   1. Postpartum depression 648.44 F53    311    2. Generalized anxiety disorder 300.02 F41.1       CCA Part One  Part One has been completed on paper by the patient.  (See scanned document in Chart Review)  CCA Part Two A  Intake/Chief Complaint:  CCA Intake With Chief Complaint CCA Part Two Date: 08/11/16 CCA Part Two Time: 1516 Chief Complaint/Presenting Problem: Had a baby 8 months ago.  Ever since anxiety has worsened  Reports "I don't feel like myself anymore."   Patients Currently Reported Symptoms/Problems: "I feel like I can't do anything right." Chest gets tight.   Mother-in-law cares for her son most days of the week.  Patient does not agree with many of the decisions she makes about caring for him.  Worries about son's safety.   Reports "I feel like she doesn't respect the way I want to raise him."      Individual's Strengths: Hard worker,  "I try to give everything 100%"  Very caring Individual's Preferences: Wants to feel more satisfied and confident in her role as a mom  Type of Services Patient Feels Are Needed: Therapy Initial Clinical Notes/Concerns: Saw a therapist for about a year in college    Mental Health Symptoms Depression:  Depression: Tearfulness, Worthlessness, Sleep (too much or little), Irritability, Hopelessness, Fatigue, Difficulty Concentrating  Mania:  Mania: N/A  Anxiety:   Anxiety: Difficulty concentrating, Fatigue, Irritability, Restlessness, Sleep, Tension, Worrying  Psychosis:  Psychosis: N/A  Trauma:  Trauma: N/A  Obsessions:  Obsessions: Recurrent & persistent thoughts/impulses/images, Cause anxiety (About son's safety)  Compulsions:  Compulsions: N/A  Inattention:  Inattention: N/A  Hyperactivity/Impulsivity:  Hyperactivity/Impulsivity: N/A  Oppositional/Defiant Behaviors:  Oppositional/Defiant Behaviors: N/A   Borderline Personality:  Emotional Irregularity: N/A  Other Mood/Personality Symptoms:      Mental Status Exam Appearance and self-care  Stature:  Stature: Average  Weight:  Weight: Average weight  Clothing:  Clothing: Neat/clean  Grooming:  Grooming: Normal  Cosmetic use:  Cosmetic Use: Age appropriate  Posture/gait:  Posture/Gait: Normal  Motor activity:  Motor Activity: Not Remarkable  Sensorium  Attention:  Attention: Normal  Concentration:  Concentration: Normal  Orientation:  Orientation: X5  Recall/memory:  Recall/Memory: Normal  Affect and Mood  Affect:  Affect: Anxious  Mood:  Mood: Anxious  Relating  Eye contact:  Eye Contact: Normal  Facial expression:  Facial Expression: Anxious  Attitude toward examiner:  Attitude Toward Examiner: Cooperative  Thought and Language  Speech Bell: Speech Bell: Normal  Thought content:  Thought Content: Appropriate to mood and circumstances  Preoccupation:  Preoccupations: Guilt  Hallucinations:     Organization:     Company secretaryxecutive Functions  Fund of Knowledge:  Fund of Knowledge: Average  Intelligence:  Intelligence: Average  Abstraction:  Abstraction: Normal  Judgement:  Judgement: Normal  Reality Testing:  Reality Testing: Adequate  Insight:  Insight: Fair  Decision Making:  Decision Making: Paralyzed  Social Functioning  Social Maturity:  Social Maturity: Responsible  Social Judgement:  Social Judgement: Normal  Stress  Stressors:  Stressors: Transitions, Family conflict  Coping Ability:  Coping Ability: Science writerverwhelmed, Horticulturist, commercialxhausted  Skill Deficits:     Supports:      Family and Psychosocial History: Family history Marital status: Married Number of Years Married: 1.5 What types of issues is patient dealing with in the relationship?: "  I think he is overwhelmed too."    Additional relationship information: Has been with Jennifer Bell for about 4 years.  Very supportive   "We don't argue."  Helpful with the baby.  Works in a Government social research officer.    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  Saw dad every other weekend.  He was an alcoholic and did drugs.  Parents were always fighting.  Always felt alone.  Both parents remarried.   Description of patient's relationship with caregiver when they were a child: Mom "We've always been really close."          "She is a very anxious person."  "She's a clean freak." Patient's description of current relationship with people who raised him/her: Mom "We talk every morning."  Has a way of making patient feel guilty.       Dad "It's very odd.  I love him because he is my dad, but I have a hard time talking to him."  He lives in [place].   Does patient have siblings?: Yes Number of Siblings: 4 Description of patient's current relationship with siblings: Sister, Jennifer Bell (22)-close, she is currently pregnant       Sister, Jennifer Bell (20) has epilepsy   Sister, Jennifer Bell (18)           Brother Jennifer Bell (17)    Did patient suffer any verbal/emotional/physical/sexual abuse as a child?: No Did patient suffer from severe childhood neglect?: No Has patient ever been sexually abused/assaulted/raped as an adolescent or adult?: No Was the patient ever a victim of a crime or a disaster?: No Witnessed domestic violence?: No Has patient been effected by domestic violence as an adult?: No  CCA Part Two B  Employment/Work Situation: Employment / Work Situation Employment situation: Employed 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  This school year  is especially stressful because they are having to use a new curriculum for math and reading. Patient's job has been impacted by current illness: No Has patient ever been in the [employer]?: No  Education: Education Did Garment/textile technologist From [employer]?: Yes Did Theme park manager?: Yes Did You Have Any Difficulty At School?: No  Religion: Religion/Spirituality Are You A Religious Person?:  (I don't go to church anymore.  I pray every night.)  Leisure/Recreation: Leisure / Recreation Leisure and Hobbies: Enjoys the times when she can spend time with just her husband and son.    Exercise/Diet: Exercise/Diet Do You Exercise?: No Have You Gained or Lost A Significant Amount of Weight in the Past Six Months?: No Do You Follow a Special Diet?: No Do You Have Any Trouble Sleeping?: Yes Explanation of Sleeping Difficulties: insomnia  CCA Part Two C  Alcohol/Drug Use: Alcohol / Drug Use History of alcohol / drug use?: No history of alcohol / drug abuse                      CCA Part Three  ASAM's:  Six Dimensions of Multidimensional Assessment  Dimension 1:  Acute Intoxication and/or Withdrawal Potential:     Dimension 2:  Biomedical Conditions and Complications:     Dimension 3:  Emotional, Behavioral, or Cognitive Conditions and Complications:     Dimension 4:  Readiness to Change:     Dimension 5:  Relapse, Continued use, or Continued Problem Potential:     Dimension 6:  Recovery/Living Environment:      Substance use Disorder (SUD)    Social Function:  Social Functioning Social Maturity: Responsible Social Judgement: Normal  Stress:  Stress Stressors: Transitions, Family conflict Coping Ability: Overwhelmed, Exhausted  Risk Assessment- Self-Harm Potential: Risk Assessment For Self-Harm Potential Thoughts of Self-Harm: No current thoughts Additional Comments for Self-Harm Potential: Denies history of harm to self  Risk Assessment -Dangerous to Others  Potential: Risk Assessment For Dangerous to Others Potential Method: No Plan Additional Comments for Danger to Others Potential: Denies history of harm to others  DSM5 Diagnoses: Patient Active Problem List   Diagnosis Date Noted  . Postpartum depression 08/15/2016  . Generalized anxiety disorder 08/15/2016  . NSVD (normal spontaneous vaginal delivery) 12/20/2015  . Antepartum mild preeclampsia 12/19/2015  . Amenorrhea 04/15/2015  . Recurrent maxillary sinusitis 02/03/2015  . Annual physical exam 12/16/2014  . Lymphadenopathy 12/16/2014  . Proteinuria 12/16/2014  . Hiatal hernia 05/28/2014  . Esophagitis, reflux 05/28/2014  . Anxiety 08/01/2013  . Cervical radiculitis 08/01/2013  . Low back pain 08/01/2013  . 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

## 2016-09-13 ENCOUNTER — Ambulatory Visit (HOSPITAL_COMMUNITY): Payer: Self-pay | Admitting: Licensed Clinical Social Worker

## 2016-09-16 NOTE — Telephone Encounter (Signed)
Called the patient did not get an answer. Rhonda Cunningham,CMA

## 2016-09-16 NOTE — Telephone Encounter (Signed)
Please call patient: Calling to follow-up on anxiety, I haven't seen her in the office for recommended follow-up to see how she is doing on her medications. If she needs to schedule a visit, let's get that taken care of. Thanks

## 2016-10-24 ENCOUNTER — Encounter (HOSPITAL_COMMUNITY): Payer: Self-pay | Admitting: Licensed Clinical Social Worker

## 2017-03-02 ENCOUNTER — Encounter: Payer: Self-pay | Admitting: Sports Medicine

## 2017-03-02 ENCOUNTER — Ambulatory Visit (INDEPENDENT_AMBULATORY_CARE_PROVIDER_SITE_OTHER): Payer: BC Managed Care – PPO | Admitting: Sports Medicine

## 2017-03-02 DIAGNOSIS — Z0000 Encounter for general adult medical examination without abnormal findings: Secondary | ICD-10-CM | POA: Diagnosis not present

## 2017-03-02 MED ORDER — NORETHIN ACE-ETH ESTRAD-FE 1-20 MG-MCG(24) PO TABS: 1.0000 | ORAL_TABLET | Freq: Every day | ORAL | 11 refills | Status: DC

## 2017-03-02 NOTE — Progress Notes (Signed)
  Subjective:    CC: Annual physical  HPI:  Jennifer Bell returns, she is doing extremely well, no complaints, here for her physical. Unfortunately her husband's job has moved to [place] so they have to move. She does need a refill of her birth control. 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  Past medical history:  Negative.  See flowsheet/record as well for more information.  Surgical history: Negative.  See flowsheet/record as well for more information.  Family history: Negative.  See flowsheet/record as well for more information.  Social history: Negative.  See flowsheet/record as well for more information.  Allergies, and medications have been entered into the medical record, reviewed, and no changes needed.    Review of Systems: No headache, visual changes, nausea, vomiting, diarrhea, constipation, dizziness, abdominal pain, skin rash, fevers, chills, night sweats, swollen lymph nodes, weight loss, chest pain, body aches, joint swelling, muscle aches, shortness of breath, mood changes, visual or auditory hallucinations.  Objective:    General: Well Developed, well nourished, and in no acute distress.  Neuro: Alert and oriented x3, extra-ocular muscles intact, sensation grossly intact. Cranial nerves II through XII are intact, motor, sensory, and coordinative functions are all intact. HEENT: Normocephalic, atraumatic, pupils equal round reactive to light, neck supple, no masses, no lymphadenopathy, thyroid nonpalpable. Oropharynx, nasopharynx, external ear canals are unremarkable. Skin: Warm and dry, no rashes noted.  Cardiac: Regular rate and rhythm, no murmurs rubs or gallops.  Respiratory: Clear to auscultation bilaterally. Not using accessory muscles, speaking in full sentences.  Abdominal: Soft, nontender, nondistended, positive bowel sounds, no masses, no organomegaly.  Musculoskeletal: Shoulder, elbow, wrist, hip, knee, ankle stable, and with full range of  motion.  Impression and Recommendations:    The patient was counselled, risk factors were discussed, anticipatory guidance given.  Annual physical exam Unremarkable physical as above. Pap smear was last month. Routine blood work ordered. 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 Needs a refill on birth control. She is thinking about weight loss medication, she will think about it and let me know, I'm happy to leave a prescription for phentermine at the front for her to start.

## 2017-03-02 NOTE — Assessment & Plan Note (Signed)
Unremarkable physical as above. Pap smear was last month. Routine blood work ordered. 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 Needs a refill on birth control. She is thinking about weight loss medication, she will think about it and let me know, I'm happy to leave a prescription for phentermine at the front for her to start.

## 2017-03-03 LAB — COMPREHENSIVE METABOLIC PANEL WITH GFR
ALT: 10 U/L (ref 6–29)
AST: 14 U/L (ref 10–30)
Alkaline Phosphatase: 73 U/L (ref 33–115)
Calcium: 9.6 mg/dL (ref 8.6–10.2)
Creat: 0.79 mg/dL (ref 0.50–1.10)
Glucose, Bld: 85 mg/dL (ref 65–99)
Total Bilirubin: 0.5 mg/dL (ref 0.2–1.2)

## 2017-03-03 LAB — CBC
HCT: 39.8 % (ref 35.0–45.0)
Hemoglobin: 13.1 g/dL (ref 11.7–15.5)
MCH: 28.6 pg (ref 27.0–33.0)
MCHC: 32.9 g/dL (ref 32.0–36.0)
MCV: 86.9 fL (ref 80.0–100.0)
MPV: 9.1 fL (ref 7.5–12.5)
Platelets: 312 10*3/uL (ref 140–400)
RBC: 4.58 MIL/uL (ref 3.80–5.10)
RDW: 12.9 % (ref 11.0–15.0)
WBC: 6.9 K/uL (ref 3.8–10.8)

## 2017-03-03 LAB — LIPID PANEL W/REFLEX DIRECT LDL
Cholesterol: 132 mg/dL (ref <200)
HDL: 55 mg/dL (ref >50)
LDL-Cholesterol: 63 mg/dL
Non-HDL Cholesterol (Calc): 77 mg/dL (ref <130)
Total CHOL/HDL Ratio: 2.4 Ratio (ref <5.0)
Triglycerides: 48 mg/dL (ref <150)

## 2017-03-03 LAB — COMPREHENSIVE METABOLIC PANEL
Albumin: 4.4 g/dL (ref 3.6–5.1)
BUN: 10 mg/dL (ref 7–25)
CO2: 27 mmol/L (ref 20–31)
Chloride: 105 mmol/L (ref 98–110)
Potassium: 4.2 mmol/L (ref 3.5–5.3)
Sodium: 139 mmol/L (ref 135–146)
Total Protein: 7 g/dL (ref 6.1–8.1)

## 2017-03-03 LAB — VITAMIN D 25 HYDROXY (VIT D DEFICIENCY, FRACTURES): Vit D, 25-Hydroxy: 38 ng/mL (ref 30–100)

## 2017-03-03 LAB — HEMOGLOBIN A1C
Hgb A1c MFr Bld: 4.9 % (ref <5.7)
Mean Plasma Glucose: 94 mg/dL

## 2017-03-03 LAB — TSH: TSH: 1.53 m[IU]/L

## 2017-03-04 LAB — QUANTIFERON TB GOLD ASSAY (BLOOD)
Interferon Gamma Release Assay: NEGATIVE
Mitogen-Nil: 9.84 IU/mL
Quantiferon Nil Value: 0.03 IU/mL
Quantiferon Tb Ag Minus Nil Value: 0.02 [IU]/mL

## 2020-05-21 ENCOUNTER — Telehealth: Payer: Self-pay | Admitting: Sports Medicine

## 2020-05-21 NOTE — Telephone Encounter (Signed)
Patient would like to re-establish with you. States that she moved and came back wanting to see you. 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 Last seen with you 01/2017. Please advise.

## 2020-05-21 NOTE — Telephone Encounter (Signed)
Sure no problem, lets set her up for new patient physical if she has not had one in the last year.

## 2020-05-22 NOTE — Telephone Encounter (Signed)
Appointment has been made, no further questions at this time.

## 2020-06-09 ENCOUNTER — Ambulatory Visit (INDEPENDENT_AMBULATORY_CARE_PROVIDER_SITE_OTHER): Payer: BC Managed Care – PPO | Admitting: Sports Medicine

## 2020-06-09 ENCOUNTER — Encounter: Payer: Self-pay | Admitting: Sports Medicine

## 2020-06-09 ENCOUNTER — Other Ambulatory Visit: Payer: Self-pay

## 2020-06-09 VITALS — BP 104/68 | HR 92 | Ht 66.25 in | Wt 150.0 lb

## 2020-06-09 DIAGNOSIS — Z111 Encounter for screening for respiratory tuberculosis: Secondary | ICD-10-CM | POA: Diagnosis not present

## 2020-06-09 DIAGNOSIS — Z0000 Encounter for general adult medical examination without abnormal findings: Secondary | ICD-10-CM

## 2020-06-09 DIAGNOSIS — E559 Vitamin D deficiency, unspecified: Secondary | ICD-10-CM | POA: Diagnosis not present

## 2020-06-09 DIAGNOSIS — F419 Anxiety disorder, unspecified: Secondary | ICD-10-CM | POA: Diagnosis not present

## 2020-06-09 DIAGNOSIS — R635 Abnormal weight gain: Secondary | ICD-10-CM

## 2020-06-09 MED ORDER — PHENTERMINE HCL 37.5 MG PO TABS: ORAL_TABLET | ORAL | 0 refills | Status: DC

## 2020-06-09 NOTE — Progress Notes (Signed)
Subjective:    CC: Annual Physical Exam  HPI:  This patient is here for their annual physical  I reviewed the past medical history, family history, social history, surgical history, and allergies today and no changes were needed.  Please see the problem list section below in epic for further details.  Past Medical History: Past Medical History:  Diagnosis Date  . Anxiety   . Barrett esophagus   . GERD (gastroesophageal reflux disease)   . Heart palpitations   . Hiatal hernia    Past Surgical History: Past Surgical History:  Procedure Laterality Date  . MYRINGOTOMY WITH TUBE PLACEMENT    . TONSILLECTOMY    . WISDOM TOOTH EXTRACTION     Social History: Social History   Socioeconomic History  . Marital status: Married    Spouse name: Not on file  . Number of children: 0  . Years of education: Not on file  . Highest education level: Not on file  Occupational History  . Occupation: Magazine features editor: Advice worker [employer]    Comment: [employer]  Tobacco Use  . Smoking status: Never Smoker  . Smokeless tobacco: Never Used  Substance and Sexual Activity  . Alcohol use: No  . Drug use: No  . Sexual activity: Yes    Birth control/protection: None  Other Topics Concern  . Not on file  Social History Narrative  . Not on file   Social Determinants of Health   Financial Resource Strain:   . Difficulty of Paying Living Expenses: Not on file  Food Insecurity:   . Worried About Programme researcher, broadcasting/film/video in the Last Year: Not on file  . Ran Out of Food in the Last Year: Not on file  Transportation Needs:   . Lack of Transportation (Medical): Not on file  . Lack of Transportation (Non-Medical): Not on file  Physical Activity:   . Days of Exercise per Week: Not on file  . Minutes of Exercise per Session: Not on file  Stress:   . Feeling of Stress : Not on file  Social Connections:   . Frequency of Communication with Friends and Family: Not on file    . Frequency of Social Gatherings with Friends and Family: Not on file  . Attends Religious Services: Not on file  . Active Member of Clubs or Organizations: Not on file  . Attends Banker Meetings: Not on file  . Marital Status: Not on file   Family History: Family History  Problem Relation Age of Onset  . Vitamin D deficiency Mother   . Non-Hodgkin's lymphoma Paternal Grandfather   . Depression Paternal Aunt   . Diabetes Maternal Grandmother   . Diabetes Maternal Grandfather   . Lung cancer Maternal Grandfather    Allergies: Allergies  Allergen Reactions  . Augmentin [Amoxicillin-Pot Clavulanate] Other (See Comments)    Abdominal Pain   Medications: See med rec.  Review of Systems: No headache, visual changes, nausea, vomiting, diarrhea, constipation, dizziness, abdominal pain, skin rash, fevers, chills, night sweats, swollen lymph nodes, weight loss, chest pain, body aches, joint swelling, muscle aches, shortness of breath, mood changes, visual or auditory hallucinations.  Objective:    General: Well Developed, well nourished, and in no acute distress.  Neuro: Alert and oriented x3, extra-ocular muscles intact, sensation grossly intact. Cranial nerves II through XII are intact, motor, sensory, and coordinative functions are all intact. HEENT: Normocephalic, atraumatic, pupils equal round reactive to light, neck supple, no  masses, no lymphadenopathy, thyroid nonpalpable. Oropharynx, nasopharynx, external ear canals are unremarkable. Skin: Warm and dry, no rashes noted.  Cardiac: Regular rate and rhythm, no murmurs rubs or gallops.  Respiratory: Clear to auscultation bilaterally. Not using accessory muscles, speaking in full sentences.  Abdominal: Soft, nontender, nondistended, positive bowel sounds, no masses, no organomegaly.  Musculoskeletal: Shoulder, elbow, wrist, hip, knee, ankle stable, and with full range of motion.  Impression and Recommendations:    The  patient was counselled, risk factors were discussed, anticipatory guidance given.  Annual physical exam Annual physical as above today. She is up-to-date on screening measures, Pap smear was normal back in March of this year. I am going to screen her for TB, she is working as a Tourist information centre manager. Checking other routine labs today. She is not fasting today but cannot come back for repeat fasting labs. We will ignore any triglyceride elevation.  Anxiety Mood has been very stable on sertraline 100 mg daily, we did discuss her issues with losing weight, we discussed the option of switching to Trintellix or Viibryd versus simply starting phentermine, she desires to leave the sertraline, we will start phentermine, she will return monthly for weight checks and refills.  Abnormal weight gain Difficulty losing weight with sertraline, having 2 children was not helpful. Starting phentermine, return monthly for weight checks and refills, goal is 120 to 130 pounds.   ___________________________________________ Ihor Austin. Benjamin Stain, M.D., ABFM., CAQSM. Primary Care and Sports Medicine [facility]  Adjunct Professor of Family Medicine  [employer] [facility] [employer]

## 2020-06-09 NOTE — Assessment & Plan Note (Signed)
Difficulty losing weight with sertraline, having 2 children was not helpful. Starting phentermine, return monthly for weight checks and refills, goal is 120 to 130 pounds.

## 2020-06-09 NOTE — Assessment & Plan Note (Signed)
Annual physical as above today. She is up-to-date on screening measures, Pap smear was normal back in March of this year. I am going to screen her for TB, she is working as a Tourist information centre manager. Checking other routine labs today. She is not fasting today but cannot come back for repeat fasting labs. We will ignore any triglyceride elevation.

## 2020-06-09 NOTE — Assessment & Plan Note (Signed)
Mood has been very stable on sertraline 100 mg daily, we did discuss her issues with losing weight, we discussed the option of switching to Trintellix or Viibryd versus simply starting phentermine, she desires to leave the sertraline, we will start phentermine, she will return monthly for weight checks and refills.

## 2020-06-11 LAB — QUANTIFERON-TB GOLD PLUS
Mitogen-NIL: >10 IU/mL
NIL: 0.02 IU/mL
QuantiFERON-TB Gold Plus: NEGATIVE
TB1-NIL: 0.01 IU/mL
TB2-NIL: 0 IU/mL

## 2020-07-07 ENCOUNTER — Encounter: Payer: Self-pay | Admitting: Sports Medicine

## 2020-07-07 ENCOUNTER — Ambulatory Visit (INDEPENDENT_AMBULATORY_CARE_PROVIDER_SITE_OTHER): Payer: BC Managed Care – PPO | Admitting: Sports Medicine

## 2020-07-07 DIAGNOSIS — R635 Abnormal weight gain: Secondary | ICD-10-CM | POA: Diagnosis not present

## 2020-07-07 NOTE — Assessment & Plan Note (Signed)
Jennifer Bell returns, after the first month of phentermine she only lost a pound. We are going to refill her medicine, we will try single additional month and if she does not have sufficient weight loss we will add Topamax, she does get some migraine so this can be helpful to kill 2 birds with 1 stone. She is a bit constipated and will get some Colace and continue to focus on high-fiber diet, she does continue to drink good amounts of water.

## 2020-07-07 NOTE — Progress Notes (Signed)
° ° °  Procedures performed today:    None.  Independent interpretation of notes and tests performed by another provider:   None.  Brief History, Exam, Impression, and Recommendations:    Abnormal weight gain Jennifer Bell returns, after the first month of phentermine she only lost a pound. We are going to refill her medicine, we will try single additional month and if she does not have sufficient weight loss we will add Topamax, she does get some migraine so this can be helpful to kill 2 birds with 1 stone. She is a bit constipated and will get some Colace and continue to focus on high-fiber diet, she does continue to drink good amounts of water.    ___________________________________________ Ihor Austin. Benjamin Stain, M.D., ABFM., CAQSM. Primary Care and Sports Medicine [facility]  Adjunct Instructor of Family Medicine  [employer] [facility]

## 2020-08-04 ENCOUNTER — Ambulatory Visit: Payer: BC Managed Care – PPO | Admitting: Sports Medicine

## 2020-11-04 ENCOUNTER — Other Ambulatory Visit: Payer: Self-pay

## 2020-11-04 MED ORDER — SERTRALINE HCL 100 MG PO TABS: 100.0000 mg | ORAL_TABLET | Freq: Every day | ORAL | 6 refills | Status: DC

## 2020-11-04 NOTE — Progress Notes (Signed)
Fax received from [facility] pharmacy for refill on Sertraline 100mg  tablets. Reviewed patient's chart and approved refill.

## 2021-09-07 ENCOUNTER — Encounter: Payer: Self-pay | Admitting: Family Medicine

## 2021-09-07 ENCOUNTER — Other Ambulatory Visit: Payer: Self-pay

## 2021-09-07 ENCOUNTER — Ambulatory Visit (INDEPENDENT_AMBULATORY_CARE_PROVIDER_SITE_OTHER): Payer: BC Managed Care – PPO | Admitting: Family Medicine

## 2021-09-07 VITALS — BP 120/78 | HR 66 | Ht 66.0 in | Wt 149.0 lb

## 2021-09-07 DIAGNOSIS — F411 Generalized anxiety disorder: Secondary | ICD-10-CM | POA: Diagnosis not present

## 2021-09-07 DIAGNOSIS — R002 Palpitations: Secondary | ICD-10-CM

## 2021-09-07 MED ORDER — DULOXETINE HCL 30 MG PO CPEP: 30.0000 mg | ORAL_CAPSULE | Freq: Every day | ORAL | 1 refills | Status: DC

## 2021-09-07 NOTE — Progress Notes (Signed)
Acute Office Visit  Subjective:    Patient ID: Jennifer Bell, female    DOB: November 16, 1990, 30 y.o.   MRN: [id number]  Chief Complaint  Patient presents with   Palpitations    HPI   Patient is in today for reports fast HR. she reports that she does have a history of palpitations starting back in college.  But more recently they have become more frequent for several weeks she has been experiencing it daily but she called today because last night it lasted for hours and she says it is never done that before.  She also feels like her chest is tight bilaterally across both sides.  She did have her apple watch on last night and says the highest heart rate she saw was 148.  She did have COVID for the third time right before Thanksgiving.  And does feel like most of those symptoms have improved.  She does have a history of anxiety and some stress.  She was recently on sertraline and felt like overall it was working well but it was causing significant weight gain so she was taken off and switched to Prozac.  The Prozac caused some abnormal thoughts so she came off of that and she is just on BuSpar.  She also noticed since coming off the sertraline that some of her more upset OCD thoughts have been creeping back in as well.  Her husband travels a lot so she is home along with her 12-year-old.  She is a Runner, broadcasting/film/video.  She denies any caffeine increase or taking a decongestant.  Past Medical History:  Diagnosis Date   Anxiety    Barrett esophagus    GERD (gastroesophageal reflux disease)    Heart palpitations    Hiatal hernia     Past Surgical History:  Procedure Laterality Date   MYRINGOTOMY WITH TUBE PLACEMENT     TONSILLECTOMY     WISDOM TOOTH EXTRACTION      Family History  Problem Relation Age of Onset   Vitamin D deficiency Mother    Non-Hodgkin's lymphoma Paternal Grandfather    Depression Paternal Aunt    Diabetes Maternal Grandmother    Diabetes Maternal Grandfather    Lung cancer  Maternal Grandfather     Social History   Socioeconomic History   Marital status: Married    Spouse name: Not on file   Number of children: 0   Years of education: Not on file   Highest education level: Not on file  Occupational History   Occupation: Magazine features editor: [employer]    Comment: [employer]  Tobacco Use   Smoking status: Never   Smokeless tobacco: Never  Substance and Sexual Activity   Alcohol use: No   Drug use: No   Sexual activity: Yes    Birth control/protection: None  Other Topics Concern   Not on file  Social History Narrative   Not on file   Social Determinants of Health   Financial Resource Strain: Not on file  Food Insecurity: Not on file  Transportation Needs: Not on file  Physical Activity: Not on file  Stress: Not on file  Social Connections: Not on file  Intimate Partner Violence: Not on file    Outpatient Medications Prior to Visit  Medication Sig Dispense Refill   busPIRone (BUSPAR) 10 MG tablet Take 10 mg by mouth 2 (two) times daily. 5 mg in the AM and 10 mg at night  levocetirizine (XYZAL) 5 MG tablet Take 5 mg by mouth every evening.     phentermine (ADIPEX-P) 37.5 MG tablet One tab by mouth qAM 30 tablet 0   sertraline (ZOLOFT) 100 MG tablet Take 1 tablet (100 mg total) by mouth daily. 30 tablet 6   No facility-administered medications prior to visit.    Allergies  Allergen Reactions   Fluoxetine Other (See Comments)    Abnormal thoughts   Augmentin [Amoxicillin-Pot Clavulanate] Other (See Comments)    Abdominal Pain    Review of Systems     Objective:    Physical Exam Constitutional:      Appearance: She is well-developed.  HENT:     Head: Normocephalic and atraumatic.     Right Ear: External ear normal.     Left Ear: External ear normal.     Nose: Nose normal.  Eyes:     Conjunctiva/sclera: Conjunctivae normal.     Pupils: Pupils are equal, round, and reactive to light.   Neck:     Thyroid: No thyromegaly.  Cardiovascular:     Rate and Rhythm: Normal rate and regular rhythm.     Heart sounds: Normal heart sounds.  Pulmonary:     Effort: Pulmonary effort is normal.     Breath sounds: Normal breath sounds. No wheezing.  Musculoskeletal:     Cervical back: Neck supple.  Lymphadenopathy:     Cervical: No cervical adenopathy.  Skin:    General: Skin is warm and dry.  Neurological:     Mental Status: She is alert and oriented to person, place, and time.    BP 120/78   Pulse 66   Ht 5\' 6"  (1.676 m)   Wt 149 lb (67.6 kg)   SpO2 100%   BMI 24.05 kg/m  Wt Readings from Last 3 Encounters:  09/07/21 149 lb (67.6 kg)  07/07/20 149 lb (67.6 kg)  06/09/20 150 lb (68 kg)    Health Maintenance Due  Topic Date Due   Hepatitis C Screening  Never done   PAP SMEAR-Modifier  11/03/2017    There are no preventive care reminders to display for this patient.   Lab Results  Component Value Date   TSH 1.53 03/02/2017   Lab Results  Component Value Date   WBC 6.9 03/02/2017   HGB 13.1 03/02/2017   HCT 39.8 03/02/2017   MCV 86.9 03/02/2017   PLT 312 03/02/2017   Lab Results  Component Value Date   NA 139 03/02/2017   K 4.2 03/02/2017   CO2 27 03/02/2017   GLUCOSE 85 03/02/2017   BUN 10 03/02/2017   CREATININE 0.79 03/02/2017   BILITOT 0.5 03/02/2017   ALKPHOS 73 03/02/2017   AST 14 03/02/2017   ALT 10 03/02/2017   PROT 7.0 03/02/2017   ALBUMIN 4.4 03/02/2017   CALCIUM 9.6 03/02/2017   ANIONGAP 6 12/22/2015   Lab Results  Component Value Date   CHOL 132 03/02/2017   Lab Results  Component Value Date   HDL 55 03/02/2017   Lab Results  Component Value Date   LDLCALC 71 12/16/2014   Lab Results  Component Value Date   TRIG 48 03/02/2017   Lab Results  Component Value Date   CHOLHDL 2.4 03/02/2017   Lab Results  Component Value Date   HGBA1C 4.9 03/02/2017       Assessment & Plan:   Problem List Items Addressed This  Visit       Other   GAD (generalized  anxiety disorder)    Discussed options she is open to just trying something different.  So we will start duloxetine.  Recommend following up with provider in about 3 weeks to see how she is doing and to make any adjustments needed.  The other option would be to just go back on sertraline which she did do well on but it did really drive up her hunger signals.  But that certainly would be an option and then work on tweaking things later on.  She is open to starting something new.  Okay to continue the BuSpar.      Relevant Medications   busPIRone (BUSPAR) 10 MG tablet   DULoxetine (CYMBALTA) 30 MG capsule   Other Visit Diagnoses     Palpitations    -  Primary   Relevant Orders   CBC with Differential/Platelet   COMPLETE METABOLIC PANEL WITH GFR   TSH   EKG 12-Lead      Palpitations - discussed ruling out potential causes such as electrolyte disturbance, thyroid, anxiety, etc. we did do an EKG today.  EKG shows rate of 66 bpm, sinus arrhythmia that is in sinus rhythm.  No acute ST-T wave changes.  She did show me the heart rate on her watch today.  Again we will get up-to-date labs and check her thyroid.  Consider heart monitor if symptoms persist.  Will evaluate for potential anemia.  Meds ordered this encounter  Medications   DULoxetine (CYMBALTA) 30 MG capsule    Sig: Take 1 capsule (30 mg total) by mouth daily.    Dispense:  30 capsule    Refill:  1     Nani Gasser, MD

## 2021-09-07 NOTE — Assessment & Plan Note (Signed)
Discussed options she is open to just trying something different.  So we will start duloxetine.  Recommend following up with provider in about 3 weeks to see how she is doing and to make any adjustments needed.  The other option would be to just go back on sertraline which she did do well on but it did really drive up her hunger signals.  But that certainly would be an option and then work on tweaking things later on.  She is open to starting something new.  Okay to continue the BuSpar.

## 2021-09-08 ENCOUNTER — Other Ambulatory Visit: Payer: Self-pay | Admitting: *Deleted

## 2021-09-08 DIAGNOSIS — Z124 Encounter for screening for malignant neoplasm of cervix: Secondary | ICD-10-CM

## 2021-09-08 LAB — CBC WITH DIFFERENTIAL/PLATELET
Absolute Monocytes: 354 cells/uL (ref 200–950)
Basophils Absolute: 42 cells/uL (ref 0–200)
Basophils Relative: 0.8 %
Eosinophils Absolute: 213 cells/uL (ref 15–500)
Eosinophils Relative: 4.1 %
HCT: 36.2 % (ref 35.0–45.0)
Hemoglobin: 12.4 g/dL (ref 11.7–15.5)
Lymphs Abs: 2309 cells/uL (ref 850–3900)
MCH: 29.5 pg (ref 27.0–33.0)
MCHC: 34.3 g/dL (ref 32.0–36.0)
MCV: 86 fL (ref 80.0–100.0)
MPV: 10.5 fL (ref 7.5–12.5)
Monocytes Relative: 6.8 %
Neutro Abs: 2283 cells/uL (ref 1500–7800)
Neutrophils Relative %: 43.9 %
Platelets: 295 10*3/uL (ref 140–400)
RBC: 4.21 10*6/uL (ref 3.80–5.10)
RDW: 11.6 % (ref 11.0–15.0)
Total Lymphocyte: 44.4 %
WBC: 5.2 10*3/uL (ref 3.8–10.8)

## 2021-09-08 LAB — COMPLETE METABOLIC PANEL WITH GFR
AG Ratio: 1.7 (calc) (ref 1.0–2.5)
ALT: 7 U/L (ref 6–29)
AST: 12 U/L (ref 10–30)
Albumin: 4.3 g/dL (ref 3.6–5.1)
Alkaline phosphatase (APISO): 51 U/L (ref 31–125)
BUN: 10 mg/dL (ref 7–25)
CO2: 27 mmol/L (ref 20–32)
Calcium: 9.7 mg/dL (ref 8.6–10.2)
Chloride: 104 mmol/L (ref 98–110)
Creat: 0.76 mg/dL (ref 0.50–0.97)
Globulin: 2.5 g/dL (calc) (ref 1.9–3.7)
Glucose, Bld: 84 mg/dL (ref 65–99)
Potassium: 3.8 mmol/L (ref 3.5–5.3)
Sodium: 139 mmol/L (ref 135–146)
Total Bilirubin: 0.4 mg/dL (ref 0.2–1.2)
Total Protein: 6.8 g/dL (ref 6.1–8.1)
eGFR: 107 mL/min/{1.73_m2} (ref >=60)

## 2021-09-08 LAB — TSH: TSH: 1.11 mIU/L

## 2021-09-08 NOTE — Progress Notes (Signed)
Call patient: Count looks good no sign of anemia.  Electrolytes are normal liver function is good.  Kidney function is stable.  Calcium level looks normal.  Thyroid also looks good.  No lab abnormalities to explain her palpitations.  I would encourage her to go ahead and start the Cymbalta and see if we can get some of her anxiety better controlled.  Also please let us know when your last Pap smear was results seem to have one on file for you and I want to get your chart updated.

## 2021-09-28 ENCOUNTER — Ambulatory Visit: Payer: BC Managed Care – PPO | Admitting: Family Medicine

## 2021-09-28 ENCOUNTER — Other Ambulatory Visit: Payer: Self-pay

## 2021-09-28 ENCOUNTER — Encounter: Payer: Self-pay | Admitting: Family Medicine

## 2021-09-28 VITALS — BP 113/62 | HR 83 | Ht 66.0 in | Wt 141.0 lb

## 2021-09-28 DIAGNOSIS — F419 Anxiety disorder, unspecified: Secondary | ICD-10-CM

## 2021-09-28 DIAGNOSIS — F411 Generalized anxiety disorder: Secondary | ICD-10-CM | POA: Diagnosis not present

## 2021-09-28 DIAGNOSIS — J029 Acute pharyngitis, unspecified: Secondary | ICD-10-CM

## 2021-09-28 DIAGNOSIS — R002 Palpitations: Secondary | ICD-10-CM

## 2021-09-28 LAB — POCT RAPID STREP A (OFFICE): Rapid Strep A Screen: NEGATIVE

## 2021-09-28 NOTE — Assessment & Plan Note (Signed)
Labs were normal.  So we discussed moving forward with additional work-up including a cardiac monitor.  She is okay with doing this we will place order for Zio patch.

## 2021-09-28 NOTE — Progress Notes (Signed)
She reports that she feels as if the medication is working however, she is on Christmas break and is not sure how effective the medication actually is.   She said that she continues to experience the palpitations but they are not as bad as they were. She stated that she notices them more when she is sitting and resting and right before she goes to bed. They last for about 8-10 minutes and she has been breathing thru them.

## 2021-09-28 NOTE — Progress Notes (Signed)
Established Patient Office Visit  Subjective:  Patient ID: Jennifer Bell, female    DOB: 05-22-1991  Age: 30 y.o. MRN: [id number]  CC:  Chief Complaint  Patient presents with   Anxiety    HPI Jennifer Bell presents for   F/U GAD and palpitations. Recent labs were normal.  When I saw her about 3 weeks ago we discussed a trial of Cymbalta as she had previously been on sertraline but felt like it really drove her hunger signals.  PHQ9 SCORE ONLY 09/28/2021 06/09/2020 07/19/2016  PHQ-9 Total Score 11 0 12  Some encounter information is confidential and restricted. Go to Review Flowsheets activity to see all data.    ST started yesterday.  Feels very sore.  Had her tonsils removed years ago.  No fever sweats or chills.  No nasal congestion or cough.  Her husband also had a sore throat first.  She still experiencing palpitations.  She says the last time she had an episode was actually last night.  She says it then starts to make her anxious and she almost wonders if that makes it worse.  She said it continued until she finally fell asleep last night.  Past Medical History:  Diagnosis Date   Anxiety    Barrett esophagus    GERD (gastroesophageal reflux disease)    Heart palpitations    Hiatal hernia     Past Surgical History:  Procedure Laterality Date   MYRINGOTOMY WITH TUBE PLACEMENT     TONSILLECTOMY     WISDOM TOOTH EXTRACTION      Family History  Problem Relation Age of Onset   Vitamin D deficiency Mother    Non-Hodgkin's lymphoma Paternal Grandfather    Depression Paternal Aunt    Diabetes Maternal Grandmother    Diabetes Maternal Grandfather    Lung cancer Maternal Grandfather     Social History   Socioeconomic History   Marital status: Married    Spouse name: Not on file   Number of children: 0   Years of education: Not on file   Highest education level: Not on file  Occupational History   Occupation: Magazine features editor: [employer]     Comment: [employer]  Tobacco Use   Smoking status: Never   Smokeless tobacco: Never  Substance and Sexual Activity   Alcohol use: No   Drug use: No   Sexual activity: Yes    Birth control/protection: None  Other Topics Concern   Not on file  Social History Narrative   Not on file   Social Determinants of Health   Financial Resource Strain: Not on file  Food Insecurity: Not on file  Transportation Needs: Not on file  Physical Activity: Not on file  Stress: Not on file  Social Connections: Not on file  Intimate Partner Violence: Not on file    Outpatient Medications Prior to Visit  Medication Sig Dispense Refill   busPIRone (BUSPAR) 10 MG tablet Take 10 mg by mouth 2 (two) times daily. 5 mg in the AM and 10 mg at night     DULoxetine (CYMBALTA) 30 MG capsule Take 1 capsule (30 mg total) by mouth daily. 30 capsule 1   No facility-administered medications prior to visit.    Allergies  Allergen Reactions   Fluoxetine Other (See Comments)    Abnormal thoughts   Augmentin [Amoxicillin-Pot Clavulanate] Other (See Comments)    Abdominal Pain    ROS Review of Systems  Objective:    Physical Exam Constitutional:      Appearance: Normal appearance. She is well-developed.  HENT:     Head: Normocephalic and atraumatic.     Right Ear: External ear normal.     Left Ear: External ear normal.     Nose: Nose normal.     Mouth/Throat:     Mouth: Mucous membranes are moist.     Pharynx: Oropharynx is clear. No oropharyngeal exudate.  Cardiovascular:     Rate and Rhythm: Normal rate and regular rhythm.     Heart sounds: Normal heart sounds.  Pulmonary:     Effort: Pulmonary effort is normal.     Breath sounds: Normal breath sounds.  Musculoskeletal:     Cervical back: Neck supple. No rigidity or tenderness.  Lymphadenopathy:     Cervical: Cervical adenopathy present.  Skin:    General: Skin is warm and dry.  Neurological:     Mental Status:  She is alert and oriented to person, place, and time.  Psychiatric:        Behavior: Behavior normal.   BP 113/62    Pulse 83    Ht $R'5\' 6"'pW$  (1.676 m)    Wt 141 lb (64 kg)    SpO2 99%    BMI 22.76 kg/m  Wt Readings from Last 3 Encounters:  09/28/21 141 lb (64 kg)  09/07/21 149 lb (67.6 kg)  07/07/20 149 lb (67.6 kg)     Health Maintenance Due  Topic Date Due   Hepatitis C Screening  Never done   PAP SMEAR-Modifier  10/03/2020    There are no preventive care reminders to display for this patient.  Lab Results  Component Value Date   TSH 1.11 09/07/2021   Lab Results  Component Value Date   WBC 5.2 09/07/2021   HGB 12.4 09/07/2021   HCT 36.2 09/07/2021   MCV 86.0 09/07/2021   PLT 295 09/07/2021   Lab Results  Component Value Date   NA 139 09/07/2021   K 3.8 09/07/2021   CO2 27 09/07/2021   GLUCOSE 84 09/07/2021   BUN 10 09/07/2021   CREATININE 0.76 09/07/2021   BILITOT 0.4 09/07/2021   ALKPHOS 73 03/02/2017   AST 12 09/07/2021   ALT 7 09/07/2021   PROT 6.8 09/07/2021   ALBUMIN 4.4 03/02/2017   CALCIUM 9.7 09/07/2021   ANIONGAP 6 12/22/2015   EGFR 107 09/07/2021   Lab Results  Component Value Date   CHOL 132 03/02/2017   Lab Results  Component Value Date   HDL 55 03/02/2017   Lab Results  Component Value Date   LDLCALC 71 12/16/2014   Lab Results  Component Value Date   TRIG 48 03/02/2017   Lab Results  Component Value Date   CHOLHDL 2.4 03/02/2017   Lab Results  Component Value Date   HGBA1C 4.9 03/02/2017      Assessment & Plan:   Problem List Items Addressed This Visit       Other   Palpitations    Labs were normal.  So we discussed moving forward with additional work-up including a cardiac monitor.  She is okay with doing this we will place order for Zio patch.      Relevant Orders   LONG TERM MONITOR (3-14 DAYS)   GAD (generalized anxiety disorder)    So far she does notice some improvement of her symptoms.  PHQ-9 score of 11  today and GAD-7 score of 12.  She rates  her symptoms as somewhat difficult she has not had any side effects thus far and does not feel like it is driving her hunger like the sertraline did.  She wants to continue with her current regimen and give it a full 8 weeks before adjusting her dose.  Plan to follow-up in about 4 to 5 weeks.  She is only been on it for about 3 thus far.      RESOLVED: Anxiety - Primary   Other Visit Diagnoses     Sore throat       Relevant Orders   POCT rapid strep A (Completed)       Sore throat-negative strep test-likely viral.  Recommend symptomatic care.  If not better in 1 week please let us know.  No orders of the defined types were placed in this encounter.   Follow-up: Return in about 1 month (around 11/01/2021) for PCP for F/U mood medication. Beatrice Lecher, MD

## 2021-09-28 NOTE — Assessment & Plan Note (Signed)
So far she does notice some improvement of her symptoms.  PHQ-9 score of 11 today and GAD-7 score of 12.  She rates her symptoms as somewhat difficult she has not had any side effects thus far and does not feel like it is driving her hunger like the sertraline did.  She wants to continue with her current regimen and give it a full 8 weeks before adjusting her dose.  Plan to follow-up in about 4 to 5 weeks.  She is only been on it for about 3 thus far.

## 2021-10-05 ENCOUNTER — Ambulatory Visit (INDEPENDENT_AMBULATORY_CARE_PROVIDER_SITE_OTHER): Payer: BC Managed Care – PPO

## 2021-10-05 ENCOUNTER — Other Ambulatory Visit: Payer: Self-pay

## 2021-10-05 DIAGNOSIS — R002 Palpitations: Secondary | ICD-10-CM

## 2021-10-05 NOTE — Progress Notes (Unsigned)
Patient came in today to have 14 day zio placed. This was completed and the patient verbalizes understanding of instructions

## 2021-10-25 ENCOUNTER — Ambulatory Visit (INDEPENDENT_AMBULATORY_CARE_PROVIDER_SITE_OTHER): Payer: BC Managed Care – PPO | Admitting: Obstetrics & Gynecology

## 2021-10-25 ENCOUNTER — Encounter: Payer: Self-pay | Admitting: Obstetrics & Gynecology

## 2021-10-25 ENCOUNTER — Other Ambulatory Visit: Payer: Self-pay

## 2021-10-25 ENCOUNTER — Other Ambulatory Visit (HOSPITAL_COMMUNITY)
Admission: RE | Admit: 2021-10-25 | Discharge: 2021-10-25 | Disposition: A | Discharge status: Home / Self Care | Payer: BC Managed Care – PPO | Source: Ambulatory Visit | Attending: Obstetrics & Gynecology | Admitting: Obstetrics & Gynecology

## 2021-10-25 VITALS — Resp 16 | Ht 67.0 in | Wt 141.0 lb

## 2021-10-25 DIAGNOSIS — Z01419 Encounter for gynecological examination (general) (routine) without abnormal findings: Secondary | ICD-10-CM | POA: Insufficient documentation

## 2021-10-25 NOTE — Progress Notes (Signed)
Subjective:     Jennifer Bell is a 31 y.o. female here for a routine exam.  Current complaints: none.    Gynecologic History Patient's last menstrual period was 09/30/2021. Contraception: vasectomy Last Pap: 3 years ago in [place]. Results were: normal per pt Last mammogram: n/a.   Obstetric History OB History  Gravida Para Term Preterm AB Living  1 1 1     1   SAB IAB Ectopic Multiple Live Births        0 1    # Outcome Date GA Lbr Len/2nd Weight Sex Delivery Anes PTL Lv  1 Term 12/20/15 [redacted]w[redacted]d 04:53 / 02:33 8 lb 12.6 oz (3.986 kg) M Vag-Spont EPI  LIV     Birth Comments: wnl     The following portions of the patient's history were reviewed and updated as appropriate: allergies, current medications, past family history, past medical history, past social history, past surgical history, and problem list.  Review of Systems Pertinent items noted in HPI and remainder of comprehensive ROS otherwise negative.    Objective:     Vitals:   10/25/21 1534  Resp: 16  Weight: 141 lb (64 kg)  Height: 5\' 7"  (1.702 m)   Vitals:  WNL General appearance: alert, cooperative and no distress  HEENT: Normocephalic, without obvious abnormality, atraumatic Eyes: negative Throat: lips, mucosa, and tongue normal; teeth and gums normal  Respiratory: Clear to auscultation bilaterally  CV: Regular rate and rhythm  Breasts:  Normal appearance, no masses or tenderness, no nipple retraction or dimpling  GI: Soft, non-tender; bowel sounds normal; no masses,  no organomegaly  GU: External Genitalia:  Tanner V, no lesion Urethra:  No prolapse   Vagina: Pink, normal rugae, no blood or discharge  Cervix: No CMT, no lesion  Uterus:  Normal size and contour, non tender  Adnexa: Normal, no masses, non tender  Musculoskeletal: No edema, redness or tenderness in the calves or thighs  Skin: No lesions or rash  Lymphatic: Axillary adenopathy: none     Psychiatric: Normal mood and behavior         Assessment:    Healthy female exam.    Plan:    Pap with cotesting Clipping or laser for ingrown hairs at inguinal region Family hx negative

## 2021-10-26 LAB — CYTOLOGY - PAP
Comment: NEGATIVE
Diagnosis: NEGATIVE
High risk HPV: NEGATIVE

## 2021-11-02 ENCOUNTER — Other Ambulatory Visit: Payer: Self-pay

## 2021-11-02 ENCOUNTER — Encounter: Payer: Self-pay | Admitting: Family Medicine

## 2021-11-02 ENCOUNTER — Ambulatory Visit: Payer: BC Managed Care – PPO | Admitting: Family Medicine

## 2021-11-02 ENCOUNTER — Other Ambulatory Visit: Payer: Self-pay | Admitting: Family Medicine

## 2021-11-02 VITALS — BP 114/65 | HR 79 | Resp 16 | Ht 67.0 in | Wt 144.0 lb

## 2021-11-02 DIAGNOSIS — F411 Generalized anxiety disorder: Secondary | ICD-10-CM

## 2021-11-02 MED ORDER — DULOXETINE HCL 60 MG PO CPEP: 60.0000 mg | ORAL_CAPSULE | Freq: Every day | ORAL | 1 refills | Status: DC

## 2021-11-02 NOTE — Assessment & Plan Note (Signed)
Overall she is improving.  PHQ-9 score 9 today down from previous of 11.  GAD-7 score of 12 today she is about the same as it was last time.  But she really does feel a noticeable improvement in her symptoms.  I think based on the scores I recommend that we go up on the Cymbalta to 60 mg.  We can always go back down if she is not happy with how she feels otherwise had like to see her back in about 6 to 8 weeks to make sure that she is doing well on it and make any adjustments or changes needed at that time but I am hopeful that it will actually hit some of the anxiety component of her symptoms a little bit better and help reduce those.  Continue with therapy.   PHQ9 SCORE ONLY 09/28/2021 06/09/2020 07/19/2016  PHQ-9 Total Score 11 0 12  Some encounter information is confidential and restricted. Go to Review Flowsheets activity to see all data.   GAD 7 : Generalized Anxiety Score 09/28/2021 06/09/2020 07/19/2016  Nervous, Anxious, on Edge 3 1 3   Control/stop worrying 2 1 3   Worry too much - different things 2 1 3   Trouble relaxing 2 1 3   Restless 1 1 3   Easily annoyed or irritable 1 0 1  Afraid - awful might happen 1 1 1   Total GAD 7 Score 12 6 17   Anxiety Difficulty Somewhat difficult Somewhat difficult Somewhat difficult  Some encounter information is confidential and restricted. Go to Review Flowsheets activity to see all data.

## 2021-11-02 NOTE — Progress Notes (Signed)
Established Patient Office Visit  Subjective:  Patient ID: Jennifer Bell, female    DOB: 1991/01/22  Age: 31 y.o. MRN: [id number]  CC:  Chief Complaint  Patient presents with   Anxiety    Follow up. Patient states Cymbalta is working well for her.     HPI Jennifer Bell presents for follow-up anxiety.  We started her on Cymbalta about 4 weeks ago.  She reports that she is happy with the medication and feels like it is working well.  Change from sertraline which she felt was really driving her hunger.  She really has not had any negative side effects.  She is wondering if she should consider going up.  She is seeing a therapist regularly and that is been helpful.  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  She still feels like she is struggling a little bit with the anxiety mostly.  So far it has been much better than the sertraline or the Paxil.  Past Medical History:  Diagnosis Date   Anxiety    Barrett esophagus    GERD (gastroesophageal reflux disease)    Heart palpitations    Hiatal hernia     Past Surgical History:  Procedure Laterality Date   MYRINGOTOMY WITH TUBE PLACEMENT     TONSILLECTOMY     WISDOM TOOTH EXTRACTION      Family History  Problem Relation Age of Onset   Vitamin D deficiency Mother    Non-Hodgkin's lymphoma Paternal Grandfather    Depression Paternal Aunt    Diabetes Maternal Grandmother    Diabetes Maternal Grandfather    Lung cancer Maternal Grandfather     Social History   Socioeconomic History   Marital status: Married    Spouse name: Not on file   Number of children: 0   Years of education: Not on file   Highest education level: Not on file  Occupational History   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[place]: [place]  Tobacco Use   Smoking status: Never   Smokeless tobacco: Never  Vaping Use   Vaping Use: Never  used  Substance and Sexual Activity   Alcohol use: No   Drug use: No   Sexual activity: Yes    Birth control/protection: None  Other Topics Concern   Not on file  Social History Narrative   Not on file   Social Determinants of Health   Financial Resource Strain: Not on file  Food Insecurity: Not on file  Transportation Needs: Not on file  Physical Activity: Not on file  Stress: Not on file  Social Connections: Not on file  Intimate Partner Violence: Not on file    Outpatient Medications Prior to Visit  Medication Sig Dispense Refill   busPIRone (BUSPAR) 10 MG tablet Take 10 mg by mouth 2 (two) times daily. 5 mg in the AM and 10 mg at night     DULoxetine (CYMBALTA) 30 MG capsule Take 1 capsule (30 mg total) by mouth daily. 30 capsule 1   No facility-administered medications prior to visit.    Allergies  Allergen Reactions   Fluoxetine Other (See Comments)    Abnormal thoughts   Augmentin [Amoxicillin-Pot Clavulanate] Other (See Comments)    Abdominal Pain    ROS Review of Systems    Objective:    Physical Exam Constitutional:  Appearance: Normal appearance. She is well-developed.  HENT:     Head: Normocephalic and atraumatic.  Cardiovascular:     Rate and Rhythm: Normal rate and regular rhythm.     Heart sounds: Normal heart sounds.  Pulmonary:     Effort: Pulmonary effort is normal.     Breath sounds: Normal breath sounds.  Skin:    General: Skin is warm and dry.  Neurological:     Mental Status: She is alert and oriented to person, place, and time.  Psychiatric:        Behavior: Behavior normal.    BP 114/65    Pulse 79    Resp 16    Ht $R'5\' 7"'MC$  (1.702 m)    Wt 144 lb (65.3 kg)    LMP 11/01/2021    SpO2 98%    BMI 22.55 kg/m  Wt Readings from Last 3 Encounters:  11/02/21 144 lb (65.3 kg)  10/25/21 141 lb (64 kg)  09/28/21 141 lb (64 kg)     Health Maintenance Due  Topic Date Due   Hepatitis C Screening  Never done    There are no  preventive care reminders to display for this patient.  Lab Results  Component Value Date   TSH 1.11 09/07/2021   Lab Results  Component Value Date   WBC 5.2 09/07/2021   HGB 12.4 09/07/2021   HCT 36.2 09/07/2021   MCV 86.0 09/07/2021   PLT 295 09/07/2021   Lab Results  Component Value Date   NA 139 09/07/2021   K 3.8 09/07/2021   CO2 27 09/07/2021   GLUCOSE 84 09/07/2021   BUN 10 09/07/2021   CREATININE 0.76 09/07/2021   BILITOT 0.4 09/07/2021   ALKPHOS 73 03/02/2017   AST 12 09/07/2021   ALT 7 09/07/2021   PROT 6.8 09/07/2021   ALBUMIN 4.4 03/02/2017   CALCIUM 9.7 09/07/2021   ANIONGAP 6 12/22/2015   EGFR 107 09/07/2021   Lab Results  Component Value Date   CHOL 132 03/02/2017   Lab Results  Component Value Date   HDL 55 03/02/2017   Lab Results  Component Value Date   LDLCALC 71 12/16/2014   Lab Results  Component Value Date   TRIG 48 03/02/2017   Lab Results  Component Value Date   CHOLHDL 2.4 03/02/2017   Lab Results  Component Value Date   HGBA1C 4.9 03/02/2017      Assessment & Plan:   Problem List Items Addressed This Visit       Other   GAD (generalized anxiety disorder) - Primary    Overall she is improving.  PHQ-9 score 9 today down from previous of 11.  GAD-7 score of 12 today she is about the same as it was last time.  But she really does feel a noticeable improvement in her symptoms.  I think based on the scores I recommend that we go up on the Cymbalta to 60 mg.  We can always go back down if she is not happy with how she feels otherwise had like to see her back in about 6 to 8 weeks to make sure that she is doing well on it and make any adjustments or changes needed at that time but I am hopeful that it will actually hit some of the anxiety component of her symptoms a little bit better and help reduce those.  Continue with therapy.   PHQ9 SCORE ONLY 09/28/2021 06/09/2020 07/19/2016  PHQ-9 Total Score 11 0 12  Some encounter  information is confidential and restricted. Go to Review Flowsheets activity to see all data.   GAD 7 : Generalized Anxiety Score 09/28/2021 06/09/2020 07/19/2016  Nervous, Anxious, on Edge 3 1 3   Control/stop worrying 2 1 3   Worry too much - different things 2 1 3   Trouble relaxing 2 1 3   Restless 1 1 3   Easily annoyed or irritable 1 0 1  Afraid - awful might happen 1 1 1   Total GAD 7 Score 12 6 17   Anxiety Difficulty Somewhat difficult Somewhat difficult Somewhat difficult  Some encounter information is confidential and restricted. Go to Review Flowsheets activity to see all data.          Relevant Medications   DULoxetine (CYMBALTA) 60 MG capsule    Meds ordered this encounter  Medications   DULoxetine (CYMBALTA) 60 MG capsule    Sig: Take 1 capsule (60 mg total) by mouth daily.    Dispense:  30 capsule    Refill:  1    Follow-up: Return in about 7 weeks (around 12/21/2021) for change in medication dose/GAD.   I spent 21 minutes on the day of the encounter to include pre-visit record review, face-to-face time with the patient and post visit ordering of test.   Beatrice Lecher, MD

## 2021-11-09 NOTE — Progress Notes (Signed)
Call pt: Please let her know that the cardiac monitor overall looked good.  There were 49 triggered events meaning those that she could feel something.  Most of those she was actually in a normal rhythm and there was nothing unusual going on.  For some of them she was in what is called sinus arrhythmia.  That is where the heartbeat alters just slightly with breathing.  But again not a worrisome arrhythmia.  And she had a few premature beats at the top part of the heart.  This can feel like a jumping or skipping or fluttering.  The great news is that they are completely harmless.  They do not cause any problem.  Some people can feel them and other people cannot.  But it does explain some of what she has been feeling.  The report also noted she had 2 brief episodes of a very fast beat from the top part of the heart but was very short-lived with 8 beats being the longest interval.  It only happened twice.  This is also not worrisome unless it is a long run of that fast heartbeat.  Does not require any type of intervention or treatment per se.  But if she feels that the early beats are uncomfortable, we could always put her on a beta-blocker to prevent those from happening as often.  But they are benign, so sometimes just knowing that everything is okay can be reassuring when she feels it.  Just let me know.  I am also always happy to discuss over a visit if she would like.

## 2021-11-24 ENCOUNTER — Other Ambulatory Visit: Payer: Self-pay

## 2021-11-24 DIAGNOSIS — F411 Generalized anxiety disorder: Secondary | ICD-10-CM

## 2021-11-24 MED ORDER — METOPROLOL SUCCINATE ER 25 MG PO TB24: 25.0000 mg | ORAL_TABLET | Freq: Every day | ORAL | 1 refills | Status: DC

## 2021-11-24 MED ORDER — DULOXETINE HCL 30 MG PO CPEP: 30.0000 mg | ORAL_CAPSULE | Freq: Every day | ORAL | 1 refills | Status: DC

## 2021-11-24 NOTE — Telephone Encounter (Signed)
Meds ordered this encounter  Medications   DULoxetine (CYMBALTA) 30 MG capsule    Sig: Take 1 capsule (30 mg total) by mouth daily.    Dispense:  90 capsule    Refill:  1   metoprolol succinate (TOPROL-XL) 25 MG 24 hr tablet    Sig: Take 1 tablet (25 mg total) by mouth daily.    Dispense:  90 tablet    Refill:  1

## 2021-11-24 NOTE — Telephone Encounter (Signed)
Patient called stating she would like to decrease to Cymbalta 30 mg daily. Patient stated the 60 mg dose has been messing with her sleep and feeling foggy headed. Patient also requesting to start on a beta blocker. Forward to Dr. Madilyn Fireman.

## 2021-11-26 NOTE — Telephone Encounter (Signed)
Left detailed message on patient's voice mail.

## 2021-11-30 ENCOUNTER — Ambulatory Visit: Payer: BC Managed Care – PPO | Admitting: Family Medicine

## 2021-11-30 NOTE — Progress Notes (Deleted)
Established Patient Office Visit  Subjective:  Patient ID: Jennifer Bell, female    DOB: 06-27-1991  Age: 31 y.o. MRN: [id number]  CC: No chief complaint on file.   HPI Jennifer Bell presents for   F/U GAD - recently switched from Zoloft to Cymbalta.  She Liked the medication. Felt it was helping and starting to work but also felt like it was driving her hunger signals but she opted to stay on it until I saw her back today.  Past Medical History:  Diagnosis Date   Anxiety    Barrett esophagus    GERD (gastroesophageal reflux disease)    Heart palpitations    Hiatal hernia     Past Surgical History:  Procedure Laterality Date   MYRINGOTOMY WITH TUBE PLACEMENT     TONSILLECTOMY     WISDOM TOOTH EXTRACTION      Family History  Problem Relation Age of Onset   Vitamin D deficiency Mother    Non-Hodgkin's lymphoma Paternal Grandfather    Depression Paternal Aunt    Diabetes Maternal Grandmother    Diabetes Maternal Grandfather    Lung cancer Maternal Grandfather     Social History   Socioeconomic History   Marital status: Married    Spouse name: Not on file   Number of children: 0   Years of education: Not on file   Highest education level: Not on file  Occupational History   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[place]: [place]  Tobacco Use   Smoking status: Never   Smokeless tobacco: Never  Vaping Use   Vaping Use: Never used  Substance and Sexual Activity   Alcohol use: No   Drug use: No   Sexual activity: Yes    Birth control/protection: None  Other Topics Concern   Not on file  Social History Narrative   Not on file   Social Determinants of Health   Financial Resource Strain: Not on file  Food Insecurity: Not on file  Transportation Needs: Not on file  Physical Activity: Not on file  Stress: Not on file  Social Connections: Not on file  Intimate Partner Violence: Not on file     Outpatient Medications Prior to Visit  Medication Sig Dispense Refill   busPIRone (BUSPAR) 10 MG tablet Take 10 mg by mouth 2 (two) times daily. 5 mg in the AM and 10 mg at night     DULoxetine (CYMBALTA) 30 MG capsule Take 1 capsule (30 mg total) by mouth daily. 90 capsule 1   metoprolol succinate (TOPROL-XL) 25 MG 24 hr tablet Take 1 tablet (25 mg total) by mouth daily. 90 tablet 1   No facility-administered medications prior to visit.    Allergies  Allergen Reactions   Fluoxetine Other (See Comments)    Abnormal thoughts   Augmentin [Amoxicillin-Pot Clavulanate] Other (See Comments)    Abdominal Pain    ROS Review of Systems    Objective:    Physical Exam  LMP 11/01/2021  Wt Readings from Last 3 Encounters:  11/02/21 144 lb (65.3 kg)  10/25/21 141 lb (64 kg)  09/28/21 141 lb (64 kg)     Health Maintenance Due  Topic Date Due   Hepatitis C Screening  Never done    There are no preventive care reminders to display for this patient.  Lab Results  Component Value Date   TSH 1.11 09/07/2021   Lab Results  Component Value  Date   WBC 5.2 09/07/2021   HGB 12.4 09/07/2021   HCT 36.2 09/07/2021   MCV 86.0 09/07/2021   PLT 295 09/07/2021   Lab Results  Component Value Date   NA 139 09/07/2021   K 3.8 09/07/2021   CO2 27 09/07/2021   GLUCOSE 84 09/07/2021   BUN 10 09/07/2021   CREATININE 0.76 09/07/2021   BILITOT 0.4 09/07/2021   ALKPHOS 73 03/02/2017   AST 12 09/07/2021   ALT 7 09/07/2021   PROT 6.8 09/07/2021   ALBUMIN 4.4 03/02/2017   CALCIUM 9.7 09/07/2021   ANIONGAP 6 12/22/2015   EGFR 107 09/07/2021   Lab Results  Component Value Date   CHOL 132 03/02/2017   Lab Results  Component Value Date   HDL 55 03/02/2017   Lab Results  Component Value Date   LDLCALC 71 12/16/2014   Lab Results  Component Value Date   TRIG 48 03/02/2017   Lab Results  Component Value Date   CHOLHDL 2.4 03/02/2017   Lab Results  Component Value Date    HGBA1C 4.9 03/02/2017      Assessment & Plan:   Problem List Items Addressed This Visit       Other   GAD (generalized anxiety disorder) - Primary    No orders of the defined types were placed in this encounter.   Follow-up: No follow-ups on file.    Beatrice Lecher, MD

## 2022-01-17 ENCOUNTER — Ambulatory Visit (INDEPENDENT_AMBULATORY_CARE_PROVIDER_SITE_OTHER): Payer: BC Managed Care – PPO

## 2022-01-17 ENCOUNTER — Ambulatory Visit: Payer: BC Managed Care – PPO | Admitting: Sports Medicine

## 2022-01-17 ENCOUNTER — Telehealth: Payer: Self-pay | Admitting: *Deleted

## 2022-01-17 DIAGNOSIS — R635 Abnormal weight gain: Secondary | ICD-10-CM | POA: Diagnosis not present

## 2022-01-17 DIAGNOSIS — R202 Paresthesia of skin: Secondary | ICD-10-CM | POA: Diagnosis not present

## 2022-01-17 DIAGNOSIS — R002 Palpitations: Secondary | ICD-10-CM

## 2022-01-17 DIAGNOSIS — R200 Anesthesia of skin: Secondary | ICD-10-CM

## 2022-01-17 DIAGNOSIS — F411 Generalized anxiety disorder: Secondary | ICD-10-CM | POA: Diagnosis not present

## 2022-01-17 MED ORDER — PHENTERMINE HCL 37.5 MG PO TABS: ORAL_TABLET | ORAL | 0 refills | Status: DC

## 2022-01-17 MED ORDER — SERTRALINE HCL 50 MG PO TABS: 25.0000 mg | ORAL_TABLET | Freq: Every day | ORAL | 3 refills | Status: DC

## 2022-01-17 NOTE — Assessment & Plan Note (Signed)
Unclear etiology, she is concerned that this is from the Cymbalta, it does sound more like a sciatic process. ?Adding lumbar spine x-rays, herniated disc conditioning, return to see me in a month for this. ?

## 2022-01-17 NOTE — Assessment & Plan Note (Signed)
[place] returns, she has severe anxiety and depression, she was having some weight gain with Zoloft and was switched to Cymbalta, maybe had some slight improvement in her weight gain but uncontrolled anxiety, even with the increase to 60 mg, she then developed some numbness and tingling in both legs running down the back. ?Cymbalta was dropped back down to 30 mg and unfortunately she continues to struggle with control of her anxiety and depression symptoms. 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 ?Ultimately I think we need to get her back on Zoloft which did a great job controlling her anxiety. ?Happy to help her keep the weight off with phentermine. ?

## 2022-01-17 NOTE — Assessment & Plan Note (Signed)
Much better on Toprol-XL. ?

## 2022-01-17 NOTE — Progress Notes (Signed)
? ? ?  Procedures performed today:   ? ?None. ? ?Independent interpretation of notes and tests performed by another provider:  ? ?None. ? ?Brief History, Exam, Impression, and Recommendations:   ? ?GAD (generalized anxiety disorder) ?[place] returns, she has severe anxiety and depression, she was having some weight gain with Zoloft and was switched to Cymbalta, maybe had some slight improvement in her weight gain but uncontrolled anxiety, even with the increase to 60 mg, she then developed some numbness and tingling in both legs running down the back. ?Cymbalta was dropped back down to 30 mg and unfortunately she continues to struggle with control of her anxiety and depression symptoms. 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 ?Ultimately I think we need to get her back on Zoloft which did a great job controlling her anxiety. ?Happy to help her keep the weight off with phentermine. ? ?Abnormal weight gain ?[place] did gain a lot of weight on Zoloft, I am happy to help her lose it with phentermine, she will start with a half tab daily as she did have some palpitations historically on this medication. ?Return monthly for weight checks and refills. ? ?Numbness and tingling of both legs ?Unclear etiology, she is concerned that this is from the Cymbalta, it does sound more like a sciatic process. ?Adding lumbar spine x-rays, herniated disc conditioning, return to see me in a month for this. ? ?Palpitations ?Much better on Toprol-XL. ? ? ? ?___________________________________________ ?Ihor Austin. Benjamin Stain, M.D., ABFM., CAQSM. ?Primary Care and Sports Medicine ?[facility] ? ?Adjunct Instructor of Family Medicine  ?[employer] ?

## 2022-01-17 NOTE — Telephone Encounter (Signed)
Pt called stating that she believed that she was having side effects from the medication that she was given. I informed her that she was given this back in January and was told to follow up with her pcp in March according to Dr. Shelah Lewandowsky note. I did express to her that I wasn't trying to put her concerns off however, since she was supposed to follow up with her pcp it would be best that she schedule an appt for this . She voiced understanding and agreed.  Pt was transferred to up front for appt to f/u on this.  ?

## 2022-01-17 NOTE — Assessment & Plan Note (Signed)
[place] did gain a lot of weight on Zoloft, I am happy to help her lose it with phentermine, she will start with a half tab daily as she did have some palpitations historically on this medication. ?Return monthly for weight checks and refills. ?

## 2022-02-14 ENCOUNTER — Ambulatory Visit: Payer: BC Managed Care – PPO | Admitting: Sports Medicine

## 2022-02-14 DIAGNOSIS — R202 Paresthesia of skin: Secondary | ICD-10-CM | POA: Diagnosis not present

## 2022-02-14 DIAGNOSIS — F411 Generalized anxiety disorder: Secondary | ICD-10-CM | POA: Diagnosis not present

## 2022-02-14 DIAGNOSIS — R200 Anesthesia of skin: Secondary | ICD-10-CM

## 2022-02-14 DIAGNOSIS — R635 Abnormal weight gain: Secondary | ICD-10-CM | POA: Diagnosis not present

## 2022-02-14 MED ORDER — SERTRALINE HCL 50 MG PO TABS: 50.0000 mg | ORAL_TABLET | Freq: Every day | ORAL | 3 refills | Status: DC

## 2022-02-14 NOTE — Assessment & Plan Note (Signed)
We transitioned her back to Zoloft and she has improved considerably, we started with 25 mg followed by 50, she has been on 50 mg now for about 2 weeks, I will refill 50s and she will continue this for at least another month before making a dose change to 75. ?Discontinue BuSpar for now (she has not been taking it) ?

## 2022-02-14 NOTE — Progress Notes (Signed)
? ? ?  Procedures performed today:   ? ?None. ? ?Independent interpretation of notes and tests performed by another provider:  ? ?None. ? ?Brief History, Exam, Impression, and Recommendations:   ? ?Abnormal weight gain ?Jennifer Bell did not start her phentermine yet, wisely so, she was coming off of her mood medication and she did not want to muddy the waters if she had worsening anxiety. ?She will go ahead and start her phentermine and we can revisit this at the 1 month follow-up. ? ?GAD (generalized anxiety disorder) ?We transitioned her back to Zoloft and she has improved considerably, we started with 25 mg followed by 50, she has been on 50 mg now for about 2 weeks, I will refill 50s and she will continue this for at least another month before making a dose change to 75. ?Discontinue BuSpar for now (she has not been taking it) ? ?Numbness and tingling of both legs ?Lumbar spine x-rays did show multilevel lumbar DDD, no change in plan for now, she will get more consistent with conditioning and at the 4-week follow-up we can proceed with MRI if not significantly better. ?She did have an asymptomatic nephrolith incidentally noted on x-rays. ? ?Chronic process with exacerbation/not at goal with pharmacologic intervention ? ?___________________________________________ ?Jennifer Bell, M.D., ABFM., CAQSM. ?Primary Care and Sports Medicine ?[facility] ? ?Adjunct Instructor of Family Medicine  ?[employer] ?

## 2022-02-14 NOTE — Assessment & Plan Note (Signed)
Jennifer Bell did not start her phentermine yet, wisely so, she was coming off of her mood medication and she did not want to muddy the waters if she had worsening anxiety. ?She will go ahead and start her phentermine and we can revisit this at the 1 month follow-up. ?

## 2022-02-14 NOTE — Assessment & Plan Note (Signed)
Lumbar spine x-rays did show multilevel lumbar DDD, no change in plan for now, she will get more consistent with conditioning and at the 4-week follow-up we can proceed with MRI if not significantly better. ?She did have an asymptomatic nephrolith incidentally noted on x-rays. ?

## 2022-03-21 ENCOUNTER — Ambulatory Visit: Payer: BC Managed Care – PPO | Admitting: Sports Medicine

## 2022-03-21 ENCOUNTER — Encounter: Payer: Self-pay | Admitting: Sports Medicine

## 2022-03-21 DIAGNOSIS — F411 Generalized anxiety disorder: Secondary | ICD-10-CM | POA: Diagnosis not present

## 2022-03-21 DIAGNOSIS — R635 Abnormal weight gain: Secondary | ICD-10-CM

## 2022-03-21 DIAGNOSIS — L237 Allergic contact dermatitis due to plants, except food: Secondary | ICD-10-CM | POA: Diagnosis not present

## 2022-03-21 MED ORDER — PHENTERMINE HCL 37.5 MG PO TABS: 37.5000 mg | ORAL_TABLET | Freq: Every day | ORAL | 0 refills | Status: DC

## 2022-03-21 MED ORDER — SERTRALINE HCL 50 MG PO TABS: 50.0000 mg | ORAL_TABLET | Freq: Every day | ORAL | 3 refills | Status: DC

## 2022-03-21 MED ORDER — TRIAMCINOLONE ACETONIDE 0.1 % EX CREA: 1.0000 | TOPICAL_CREAM | Freq: Two times a day (BID) | CUTANEOUS | 0 refills | Status: DC

## 2022-03-21 NOTE — Assessment & Plan Note (Signed)
1 pound weight loss, only doing half tab of phentermine, she will go up to a full tab and continue with monthly weight checks and refills, entering the second month.

## 2022-03-21 NOTE — Progress Notes (Signed)
    Procedures performed today:    None.  Independent interpretation of notes and tests performed by another provider:   None.  Brief History, Exam, Impression, and Recommendations:    Abnormal weight gain 1 pound weight loss, only doing half tab of phentermine, she will go up to a full tab and continue with monthly weight checks and refills, entering the second month.  GAD (generalized anxiety disorder) Fantastic response on Zoloft 50, continue long-term.  Poison ivy dermatitis Erythematous, linear punctate lesions consistent with poison ivy dermatitis, intensely pruritic, adding topical triamcinolone.    ___________________________________________ Ihor Austin. Benjamin Stain, M.D., ABFM., CAQSM. Primary Care and Sports Medicine [facility]  Adjunct Instructor of Family Medicine  [employer]

## 2022-03-21 NOTE — Assessment & Plan Note (Signed)
Fantastic response on Zoloft 50, continue long-term.

## 2022-03-21 NOTE — Assessment & Plan Note (Signed)
Erythematous, linear punctate lesions consistent with poison ivy dermatitis, intensely pruritic, adding topical triamcinolone.

## 2022-04-25 ENCOUNTER — Ambulatory Visit: Payer: BC Managed Care – PPO | Admitting: Sports Medicine

## 2022-05-21 ENCOUNTER — Other Ambulatory Visit: Payer: Self-pay | Admitting: Family Medicine

## 2022-06-15 ENCOUNTER — Encounter: Payer: Self-pay | Admitting: Sports Medicine

## 2022-06-15 ENCOUNTER — Ambulatory Visit (INDEPENDENT_AMBULATORY_CARE_PROVIDER_SITE_OTHER): Payer: 59 | Admitting: Sports Medicine

## 2022-06-15 VITALS — BP 104/66 | HR 63 | Ht 66.0 in | Wt 153.0 lb

## 2022-06-15 DIAGNOSIS — Z0000 Encounter for general adult medical examination without abnormal findings: Secondary | ICD-10-CM

## 2022-06-15 DIAGNOSIS — R635 Abnormal weight gain: Secondary | ICD-10-CM

## 2022-06-15 DIAGNOSIS — R002 Palpitations: Secondary | ICD-10-CM

## 2022-06-15 MED ORDER — TOPIRAMATE 50 MG PO TABS: ORAL_TABLET | ORAL | 3 refills | Status: DC

## 2022-06-15 MED ORDER — METOPROLOL SUCCINATE ER 25 MG PO TB24: 25.0000 mg | ORAL_TABLET | Freq: Every day | ORAL | 3 refills | Status: DC

## 2022-06-15 NOTE — Assessment & Plan Note (Addendum)
Did not tolerate phentermine, not have enough for GLP-1s, adding topiramate. She is doing a boot camp type exercise program, has been doing it for about 4 weeks but not noticing any changes in how her clothes fit just yet. Advised her this can take 6 to 12 weeks and to give it more time. Patient cautioned on adverse effects of topiramate. Return see me in 4 weeks.

## 2022-06-15 NOTE — Assessment & Plan Note (Signed)
Historically doing really well on Toprol-XL, refilling medication.

## 2022-06-15 NOTE — Assessment & Plan Note (Signed)
Pleasant 31 year old female, here for her physical, declines flu shot. Ordering routine labs. Return to see me in 1 year for this.

## 2022-06-15 NOTE — Progress Notes (Signed)
Subjective:    CC: Annual Physical Exam  HPI:  This patient is here for their annual physical  I reviewed the past medical history, family history, social history, surgical history, and allergies today and no changes were needed.  Please see the problem list section below in epic for further details.  Past Medical History: Past Medical History:  Diagnosis Date   Anxiety    Barrett esophagus    GERD (gastroesophageal reflux disease)    Heart palpitations    Hiatal hernia    Past Surgical History: Past Surgical History:  Procedure Laterality Date   MYRINGOTOMY WITH TUBE PLACEMENT     TONSILLECTOMY     WISDOM TOOTH EXTRACTION     Social History: Social History   Socioeconomic History   Marital status: Married    Spouse name: Not on file   Number of children: 0   Years of education: Not on file   Highest education level: Not on file  Occupational History   Occupation: Magazine features editor: [employer]    Comment: [employer]  Tobacco Use   Smoking status: Never   Smokeless tobacco: Never  Vaping Use   Vaping Use: Never used  Substance and Sexual Activity   Alcohol use: No   Drug use: No   Sexual activity: Yes    Birth control/protection: None  Other Topics Concern   Not on file  Social History Narrative   Not on file   Social Determinants of Health   Financial Resource Strain: Not on file  Food Insecurity: Not on file  Transportation Needs: Not on file  Physical Activity: Not on file  Stress: Not on file  Social Connections: Not on file   Family History: Family History  Problem Relation Age of Onset   Vitamin D deficiency Mother    Non-Hodgkin's lymphoma Paternal Grandfather    Depression Paternal Aunt    Diabetes Maternal Grandmother    Diabetes Maternal Grandfather    Lung cancer Maternal Grandfather    Allergies: Allergies  Allergen Reactions   Augmentin [Amoxicillin-Pot Clavulanate] Other (See Comments)     Abdominal Pain   Medications: See med rec.  Review of Systems: No headache, visual changes, nausea, vomiting, diarrhea, constipation, dizziness, abdominal pain, skin rash, fevers, chills, night sweats, swollen lymph nodes, weight loss, chest pain, body aches, joint swelling, muscle aches, shortness of breath, mood changes, visual or auditory hallucinations.  Objective:    General: Well Developed, well nourished, and in no acute distress.  Neuro: Alert and oriented x3, extra-ocular muscles intact, sensation grossly intact. Cranial nerves II through XII are intact, motor, sensory, and coordinative functions are all intact. HEENT: Normocephalic, atraumatic, pupils equal round reactive to light, neck supple, no masses, no lymphadenopathy, thyroid nonpalpable. Oropharynx, nasopharynx, external ear canals are unremarkable. Skin: Warm and dry, no rashes noted.  Cardiac: Regular rate and rhythm, no murmurs rubs or gallops.  Respiratory: Clear to auscultation bilaterally. Not using accessory muscles, speaking in full sentences.  Abdominal: Soft, nontender, nondistended, positive bowel sounds, no masses, no organomegaly.  Musculoskeletal: Shoulder, elbow, wrist, hip, knee, ankle stable, and with full range of motion.  Impression and Recommendations:    The patient was counselled, risk factors were discussed, anticipatory guidance given.  Annual physical exam Pleasant 31 year old female, here for her physical, declines flu shot. Ordering routine labs. Return to see me in 1 year for this.  Abnormal weight gain Did not tolerate phentermine, not have enough for GLP-1s,  adding topiramate. She is doing a boot camp type exercise program, has been doing it for about 4 weeks but not noticing any changes in how her clothes fit just yet. Advised her this can take 6 to 12 weeks and to give it more time. Patient cautioned on adverse effects of topiramate. Return see me in 4  weeks.  Palpitations Historically doing really well on Toprol-XL, refilling medication.  ____________________________________________ Ihor Austin. Benjamin Stain, M.D., ABFM., CAQSM., AME. Primary Care and Sports Medicine [facility]  Adjunct Professor of Family Medicine  [employer] [facility] [employer]  Restaurant manager, fast food

## 2022-07-21 ENCOUNTER — Encounter: Payer: Self-pay | Admitting: Sports Medicine

## 2022-07-23 LAB — HEMOGLOBIN A1C
Hgb A1c MFr Bld: 5 % of total Hgb (ref <5.7)
Mean Plasma Glucose: 97 mg/dL
eAG (mmol/L): 5.4 mmol/L

## 2022-07-23 LAB — COMPLETE METABOLIC PANEL WITH GFR
AG Ratio: 2.2 (calc) (ref 1.0–2.5)
ALT: 9 U/L (ref 6–29)
AST: 15 U/L (ref 10–30)
Albumin: 4.6 g/dL (ref 3.6–5.1)
Alkaline phosphatase (APISO): 56 U/L (ref 31–125)
BUN: 13 mg/dL (ref 7–25)
CO2: 26 mmol/L (ref 20–32)
Calcium: 9.1 mg/dL (ref 8.6–10.2)
Chloride: 106 mmol/L (ref 98–110)
Creat: 0.73 mg/dL (ref 0.50–0.97)
Globulin: 2.1 g/dL (calc) (ref 1.9–3.7)
Glucose, Bld: 85 mg/dL (ref 65–99)
Potassium: 4.3 mmol/L (ref 3.5–5.3)
Sodium: 139 mmol/L (ref 135–146)
Total Bilirubin: 0.5 mg/dL (ref 0.2–1.2)
Total Protein: 6.7 g/dL (ref 6.1–8.1)
eGFR: 112 mL/min/{1.73_m2} (ref >=60)

## 2022-07-23 LAB — CBC
HCT: 39.3 % (ref 35.0–45.0)
Hemoglobin: 13.2 g/dL (ref 11.7–15.5)
MCH: 29.9 pg (ref 27.0–33.0)
MCHC: 33.6 g/dL (ref 32.0–36.0)
MCV: 88.9 fL (ref 80.0–100.0)
MPV: 9.8 fL (ref 7.5–12.5)
Platelets: 269 10*3/uL (ref 140–400)
RBC: 4.42 10*6/uL (ref 3.80–5.10)
RDW: 11.7 % (ref 11.0–15.0)
WBC: 5 10*3/uL (ref 3.8–10.8)

## 2022-07-23 LAB — LIPID PANEL
Cholesterol: 162 mg/dL (ref <200)
HDL: 60 mg/dL (ref >=50)
LDL Cholesterol (Calc): 87 mg/dL (calc)
Non-HDL Cholesterol (Calc): 102 mg/dL (calc) (ref <130)
Total CHOL/HDL Ratio: 2.7 (calc) (ref <5.0)
Triglycerides: 60 mg/dL (ref <150)

## 2022-07-23 LAB — TSH: TSH: 1.89 mIU/L

## 2022-09-22 ENCOUNTER — Encounter: Payer: Self-pay | Admitting: Family Medicine

## 2022-09-22 ENCOUNTER — Ambulatory Visit: Payer: 59 | Admitting: Family Medicine

## 2022-09-22 VITALS — BP 96/64 | HR 57 | Ht 66.0 in | Wt 155.0 lb

## 2022-09-22 DIAGNOSIS — Z7182 Exercise counseling: Secondary | ICD-10-CM | POA: Diagnosis not present

## 2022-09-22 DIAGNOSIS — L853 Xerosis cutis: Secondary | ICD-10-CM

## 2022-09-22 DIAGNOSIS — R239 Unspecified skin changes: Secondary | ICD-10-CM

## 2022-09-22 MED ORDER — HYDROXYZINE HCL 10 MG PO TABS: 10.0000 mg | ORAL_TABLET | Freq: Three times a day (TID) | ORAL | 0 refills | Status: DC | PRN

## 2022-09-22 NOTE — Progress Notes (Signed)
Acute Office Visit  Subjective:     Patient ID: Jennifer Bell, female    DOB: 10/21/1990, 31 y.o.   MRN: [id number]  Chief Complaint  Patient presents with   Rash    Rash  Patient is in today for acute visit.  Pt reports a hx of chronic skin changes and weight gain. She reports a rash that has developed on her chest, face, and back. Her skin is very itchy. She reports using all natural detergents and Native body wash. She reports her son has eczema so she also has used her son's Aveeno lotion in the last few weeks. She reports normal periods. No changes in detergents, no new medicines or foods.  Pt reports never moisturizing her skin after bathing. She does have seasonal allergies and taking daily Xyzal. She also has had weight gain. She would like to get back down to 140 lbs. She reports not eating that much and walking 3 miles a day.    Review of Systems  Constitutional:        Weight gain  Skin:  Positive for rash.  All other systems reviewed and are negative.       Objective:    BP 96/64   Pulse (!) 57   Ht 5\' 6"  (1.676 m)   Wt 155 lb (70.3 kg)   SpO2 99%   BMI 25.02 kg/m    Physical Exam Vitals and nursing note reviewed.  Constitutional:      Appearance: Normal appearance. She is normal weight.  HENT:     Head: Normocephalic and atraumatic.     Right Ear: External ear normal.     Left Ear: External ear normal.     Nose: Nose normal.     Mouth/Throat:     Mouth: Mucous membranes are moist.     Pharynx: Oropharynx is clear.  Eyes:     Extraocular Movements: Extraocular movements intact.     Conjunctiva/sclera: Conjunctivae normal.  Cardiovascular:     Rate and Rhythm: Bradycardia present.  Pulmonary:     Effort: Pulmonary effort is normal. No respiratory distress.  Skin:    Capillary Refill: Capillary refill takes less than 2 seconds.     Findings: Rash present.  Neurological:     General: No focal deficit present.     Mental Status: She is  alert and oriented to person, place, and time. Mental status is at baseline.  Psychiatric:        Mood and Affect: Mood normal.        Behavior: Behavior normal.        Thought Content: Thought content normal.        Judgment: Judgment normal.    No results found for any visits on 09/22/22.      Assessment & Plan:   Problem List Items Addressed This Visit   None Visit Diagnoses     Xerosis of skin    -  Primary   Relevant Medications   hydrOXYzine (ATARAX) 10 MG tablet   Other Relevant Orders   TSH + free T4   Recent skin changes       Exercise counseling          Due to skin changes, dry skin and weight gain; will proceed with screening thyroid. Also advised pt that dry skin can cause itchy skin. She should continue hypo-allogeneic creams such as aveeno to moisturize after bathing.  Will add Hydroxyzine to use prn for itching.  If no  better, follow up with PCP Meds ordered this encounter  Medications   hydrOXYzine (ATARAX) 10 MG tablet    Sig: Take 1 tablet (10 mg total) by mouth 3 (three) times daily as needed for itching.    Dispense:  30 tablet    Refill:  0    Return if symptoms worsen or fail to improve with PCP.  Suzan Slick, MD

## 2022-09-23 LAB — TSH+FREE T4
Free T4: 0.93 ng/dL (ref 0.82–1.77)
TSH: 1.27 u[IU]/mL (ref 0.450–4.500)

## 2022-10-11 ENCOUNTER — Telehealth: Payer: Self-pay | Admitting: *Deleted

## 2022-10-11 NOTE — Telephone Encounter (Signed)
Left patient a message to call and schedule annual with Dr. Leggett for Feb. 

## 2022-11-18 NOTE — Progress Notes (Unsigned)
Last Mammogram: n/a Last Pap Smear:  10/25/21- negative Last Colon Screening;  n/a Seat Belts:   yes Sun Screen:   yes Dental Check Up:  yes Brush & Floss:  yes

## 2022-11-21 ENCOUNTER — Encounter: Payer: Self-pay | Admitting: Obstetrics & Gynecology

## 2022-11-21 ENCOUNTER — Ambulatory Visit (INDEPENDENT_AMBULATORY_CARE_PROVIDER_SITE_OTHER): Payer: 59 | Admitting: Obstetrics & Gynecology

## 2022-11-21 VITALS — BP 98/59 | HR 87 | Ht 66.0 in | Wt 150.0 lb

## 2022-11-21 DIAGNOSIS — F419 Anxiety disorder, unspecified: Secondary | ICD-10-CM | POA: Diagnosis not present

## 2022-11-21 DIAGNOSIS — Z01419 Encounter for gynecological examination (general) (routine) without abnormal findings: Secondary | ICD-10-CM | POA: Diagnosis not present

## 2022-11-21 DIAGNOSIS — F411 Generalized anxiety disorder: Secondary | ICD-10-CM | POA: Diagnosis not present

## 2022-11-21 NOTE — Progress Notes (Signed)
Subjective:     Jennifer Bell is a 32 y.o. female here for a routine exam.  Current complaints: getting over flu--lingering.  Pt also complaining of anxiety.  She is on 50 mg zoloft and increasing makes her feel like a zombie.  She can't tolerate Buspar.  Her PCP is not helping her enough and interested in referral to psych.   Gynecologic History Patient's last menstrual period was 11/10/2022. Contraception: vasectomy Last Mammogram: n/a Last Pap Smear:  10/25/21- negative Last Colon Screening;  n/a Seat Belts:   yes Sun Screen:   yes Dental Check Up:  yes Brush & Floss:  yes     Obstetric History OB History  Gravida Para Term Preterm AB Living  2 2 2     1  $ SAB IAB Ectopic Multiple Live Births        0 2    # Outcome Date GA Lbr Len/2nd Weight Sex Delivery Anes PTL Lv  2 Term 12/20/15 [id number]:53 / 02:33 8 lb 12.6 oz (3.986 kg) M Vag-Spont EPI  LIV     Birth Comments: wnl  1 Term              The following portions of the patient's history were reviewed and updated as appropriate: allergies, current medications, past family history, past medical history, past social history, past surgical history, and problem list.  Review of Systems Pertinent items noted in HPI and remainder of comprehensive ROS otherwise negative.    Objective:     Vitals:   11/21/22 1108  BP: (!) 98/59  Pulse: 87  Weight: 150 lb (68 kg)  Height: 5' 6"$  (1.676 m)   Vitals:  WNL General appearance: alert, cooperative and no distress  HEENT: Normocephalic, without obvious abnormality, atraumatic Eyes: negative; Ears; Full TM on left--no evidence of infection. Throat: lips, mucosa, and tongue normal; teeth and gums normal  Respiratory: Clear to auscultation bilaterally  CV: Regular rate and rhythm  Breasts:  Normal appearance, no masses or tenderness, no nipple retraction or dimpling  GI: Soft, non-tender; bowel sounds normal; no masses,  no organomegaly  GU: External Genitalia:  Tanner V, no  lesion Urethra:  No prolapse   Vagina: Pink, normal rugae, no blood or discharge  Cervix: No CMT, no lesion  Uterus:  Normal size and contour, non tender  Adnexa: Normal, no masses, non tender  Musculoskeletal: No edema, redness or tenderness in the calves or thighs  Skin: No lesions or rash  Lymphatic: Axillary adenopathy: none     Psychiatric: Normal mood and behavior        Assessment:    Healthy female exam.  Anxiety   Plan:    MGM had ovarian cancer--she is going to ask her mother if she was tested for BRCA or other genetic mutation Anxiety--referral to psych Flu--exam benign.  Left TM full--suggest sudafed and make appt with PCP Root canal today at 3 pm for cracked tooth. Up to date on pap smear.

## 2022-11-22 ENCOUNTER — Ambulatory Visit (INDEPENDENT_AMBULATORY_CARE_PROVIDER_SITE_OTHER): Payer: 59

## 2022-11-22 ENCOUNTER — Encounter: Payer: Self-pay | Admitting: Sports Medicine

## 2022-11-22 ENCOUNTER — Ambulatory Visit: Payer: 59 | Admitting: Sports Medicine

## 2022-11-22 VITALS — BP 106/83 | HR 78 | Temp 98.6°F

## 2022-11-22 DIAGNOSIS — J101 Influenza due to other identified influenza virus with other respiratory manifestations: Secondary | ICD-10-CM

## 2022-11-22 DIAGNOSIS — R059 Cough, unspecified: Secondary | ICD-10-CM | POA: Diagnosis not present

## 2022-11-22 LAB — POCT RAPID STREP A (OFFICE): Rapid Strep A Screen: NEGATIVE

## 2022-11-22 LAB — POCT INFLUENZA A/B
Influenza A, POC: NEGATIVE
Influenza B, POC: POSITIVE — AB

## 2022-11-22 LAB — POC COVID19 BINAXNOW: SARS Coronavirus 2 Ag: NEGATIVE

## 2022-11-22 MED ORDER — HYDROCOD POLI-CHLORPHE POLI ER 10-8 MG/5ML PO SUER: 5.0000 mL | Freq: Two times a day (BID) | ORAL | 0 refills | Status: DC | PRN

## 2022-11-22 MED ORDER — PREDNISONE 50 MG PO TABS: ORAL_TABLET | ORAL | 0 refills | Status: DC

## 2022-11-22 NOTE — Assessment & Plan Note (Addendum)
Pleasant previously healthy 32 year old female, she has had sore throat, malaise for approximately 10 or 11 days now, her husband and daughter were diagnosed with positive influenza. She has minimal post nasal drip findings on exam but otherwise unremarkable with the exception of an ill-appearing female. Today she had a negative COVID and strep test and a positive influenza B test. She is out of the window for Tamiflu but we can certainly help her out with some steroids. Will do 5 days of prednisone, chest x-ray, Tussionex, out of work until next Monday, return to see me as needed.

## 2022-11-22 NOTE — Progress Notes (Addendum)
    Procedures performed today:    None.  Independent interpretation of notes and tests performed by another provider:   None.  Brief History, Exam, Impression, and Recommendations:    Influenza B Pleasant previously healthy 32 year old female, she has had sore throat, malaise for approximately 10 or 11 days now, her husband and daughter were diagnosed with positive influenza. She has minimal post nasal drip findings on exam but otherwise unremarkable with the exception of an ill-appearing female. Today she had a negative COVID and strep test and a positive influenza B test. She is out of the window for Tamiflu but we can certainly help her out with some steroids. Will do 5 days of prednisone, chest x-ray, Tussionex, out of work until next Monday, return to see me as needed.  I spent 30 minutes of total time managing this patient today, this includes chart review, face to face, and non-face to face time.  ____________________________________________ Gwen Her. Dianah Field, M.D., ABFM., CAQSM., AME. Primary Care and Sports Medicine [facility]  Adjunct Professor of [employer] [facility] of Medicine  Risk manager

## 2022-11-22 NOTE — Addendum Note (Signed)
Addended by: Royetta Car on: 11/22/2022 04:43 PM   Modules accepted: Orders

## 2022-12-21 ENCOUNTER — Ambulatory Visit (INDEPENDENT_AMBULATORY_CARE_PROVIDER_SITE_OTHER): Payer: 59 | Admitting: Family Medicine

## 2022-12-21 ENCOUNTER — Encounter: Payer: Self-pay | Admitting: Family Medicine

## 2022-12-21 VITALS — BP 120/81 | HR 72 | Temp 98.1°F | Ht 66.0 in | Wt 149.7 lb

## 2022-12-21 DIAGNOSIS — R300 Dysuria: Secondary | ICD-10-CM | POA: Diagnosis not present

## 2022-12-21 DIAGNOSIS — R112 Nausea with vomiting, unspecified: Secondary | ICD-10-CM | POA: Diagnosis not present

## 2022-12-21 DIAGNOSIS — R1084 Generalized abdominal pain: Secondary | ICD-10-CM

## 2022-12-21 LAB — POCT URINALYSIS DIP (CLINITEK)
Bilirubin, UA: NEGATIVE
Glucose, UA: NEGATIVE mg/dL
Ketones, POC UA: NEGATIVE mg/dL
Leukocytes, UA: NEGATIVE
Nitrite, UA: NEGATIVE
POC PROTEIN,UA: 30 — AB
Spec Grav, UA: >=1.03 — AB (ref 1.010–1.025)
Urobilinogen, UA: 0.2 E.U./dL
pH, UA: 5.5 (ref 5.0–8.0)

## 2022-12-21 MED ORDER — ONDANSETRON HCL 8 MG PO TABS: 8.0000 mg | ORAL_TABLET | Freq: Three times a day (TID) | ORAL | 0 refills | Status: DC | PRN

## 2022-12-21 NOTE — Assessment & Plan Note (Signed)
Urine dip with moderate blood, negative leukocytes, negative nitrites.  Shared decision making, will send for culture before treating.  She will continue taking AZO as needed and increasing water intake.  If culture is positive, will send antibiotic.

## 2022-12-21 NOTE — Assessment & Plan Note (Addendum)
Describes abdominal pain as "cramping ".  Upon physical exam there is generalized discomfort. Vomiting, and diarrhea have resolved, continues to have nausea.  Home pregnancy test negative, pregnancy test negative in office today as well.  Appetite continues to be low.  She is able to drink water.  Zofran 8 mg every 8 hours as needed for nausea.  Discussed abdominal pain and symptoms that would warrant seeking a higher level of care. Recommend BRAT diet until symptoms improve.

## 2022-12-21 NOTE — Progress Notes (Signed)
Established Patient Office Visit  Subjective   Patient ID: Jennifer Bell, female    DOB: [birth year]  Age: 32 y.o. MRN: [id number]  Chief Complaint  Patient presents with   Dysuria    Started Sunday, she took she took AZO's and it started to subside.   Abdominal Pain    Started Friday. With nausea, vomiting, and diarrhea. She states she has not had an appetite since then. She denies fever.     HPI Presents today for an acute visit with complaint of burning after urination, frequency and urgency which started on Sunday.   Nausea, vomiting and diarrhea started on Friday and vomiting and diarrhea resolved on Saturday morning, continues to have nausea.  LMP 11/10/22, took pregnancy test which was negative. Keeping water down appetite is low, eating a bland diet.  Symptoms have been present for 5 days  Associated symptoms include: chills on Friday Pertinent negatives: no fever  Pain severity: abdominal cramping  Treatments tried include : AZO and drinking water Treatment effective : which helped with symptoms    Review of Systems  Constitutional:  Positive for chills. Negative for fever.  Respiratory:  Negative for shortness of breath.   Cardiovascular:  Negative for chest pain.  Gastrointestinal:  Positive for abdominal pain, diarrhea, nausea and vomiting.  Genitourinary:  Positive for dysuria, frequency and urgency. Negative for flank pain and hematuria.      Objective:     BP 120/81   Pulse 72   Temp 98.1 F (36.7 C) (Oral)   Ht 5\' 6"  (1.676 m)   Wt 149 lb 11.2 oz (67.9 kg)   LMP 11/10/2022   SpO2 100%   BMI 24.16 kg/m  BP Readings from Last 3 Encounters:  12/21/22 120/81  11/22/22 106/83  11/21/22 (!) 98/59      Physical Exam Vitals and nursing note reviewed.  Constitutional:      General: She is not in acute distress.    Appearance: She is well-developed.  Cardiovascular:     Rate and Rhythm: Regular rhythm.     Heart sounds: Normal heart sounds.   Pulmonary:     Effort: Pulmonary effort is normal.     Breath sounds: Normal breath sounds.  Abdominal:     General: Bowel sounds are normal.     Palpations: Abdomen is soft.     Tenderness: There is abdominal tenderness (generalized). There is no right CVA tenderness or left CVA tenderness.  Skin:    General: Skin is warm and dry.     Capillary Refill: Capillary refill takes less than 2 seconds.  Neurological:     General: No focal deficit present.     Mental Status: She is alert. Mental status is at baseline.  Psychiatric:        Mood and Affect: Mood normal.        Behavior: Behavior normal.        Thought Content: Thought content normal.        Judgment: Judgment normal.     Results for orders placed or performed in visit on 12/21/22  POCT URINALYSIS DIP (CLINITEK)  Result Value Ref Range   Color, UA yellow yellow   Clarity, UA clear clear   Glucose, UA negative negative mg/dL   Bilirubin, UA negative negative   Ketones, POC UA negative negative mg/dL   Spec Grav, UA >=1.030 (A) 1.010 - 1.025   Blood, UA moderate (A) negative   pH, UA 5.5 5.0 - 8.0  POC PROTEIN,UA =30 (A) negative, trace   Urobilinogen, UA 0.2 0.2 or 1.0 E.U./dL   Nitrite, UA Negative Negative   Leukocytes, UA Negative Negative      The ASCVD Risk score (Arnett DK, et al., 2019) failed to calculate for the following reasons:   The 2019 ASCVD risk score is only valid for ages 35 to 34    Assessment & Plan:   Problem List Items Addressed This Visit     Generalized abdominal pain    Describes abdominal pain as "cramping ".  Upon physical exam there is generalized discomfort. Vomiting, and diarrhea have resolved, continues to have nausea.  Home pregnancy test negative, pregnancy test negative in office today as well.  Appetite continues to be low.  She is able to drink water.  Zofran 8 mg every 8 hours as needed for nausea.  Discussed abdominal pain and symptoms that would warrant seeking a higher  level of care. Recommend BRAT diet until symptoms improve.       Dysuria - Primary    Urine dip with moderate blood, negative leukocytes, negative nitrites.  Shared decision making, will send for culture before treating.  She will continue taking AZO as needed and increasing water intake.  If culture is positive, will send antibiotic.      Relevant Orders   POCT URINALYSIS DIP (CLINITEK) (Completed)   Urine Culture   Nausea and vomiting  Agrees with plan of care discussed.  Questions answered.   Return if symptoms worsen or fail to improve.    Chalmers Guest, FNP

## 2022-12-23 LAB — URINE CULTURE: Organism ID, Bacteria: NO GROWTH

## 2023-01-02 ENCOUNTER — Telehealth (HOSPITAL_COMMUNITY): Payer: Self-pay | Admitting: Psychiatry

## 2023-01-31 ENCOUNTER — Other Ambulatory Visit: Payer: Self-pay | Admitting: Family Medicine

## 2023-01-31 DIAGNOSIS — L853 Xerosis cutis: Secondary | ICD-10-CM

## 2023-02-01 ENCOUNTER — Telehealth (HOSPITAL_COMMUNITY): Payer: 59 | Admitting: Psychiatry

## 2023-02-01 ENCOUNTER — Encounter (HOSPITAL_COMMUNITY): Payer: Self-pay | Admitting: Psychiatry

## 2023-02-01 DIAGNOSIS — F411 Generalized anxiety disorder: Secondary | ICD-10-CM

## 2023-02-01 DIAGNOSIS — R1084 Generalized abdominal pain: Secondary | ICD-10-CM

## 2023-02-01 DIAGNOSIS — F330 Major depressive disorder, recurrent, mild: Secondary | ICD-10-CM

## 2023-02-01 MED ORDER — SERTRALINE HCL 50 MG PO TABS
75.0000 mg | ORAL_TABLET | Freq: Every day | ORAL | 0 refills | Status: DC
Start: 2023-02-01 — End: 2023-03-07

## 2023-02-01 NOTE — Progress Notes (Signed)
Psychiatric Initial Adult Assessment   Patient Identification: Jennifer Bell MRN:  [id number] Date of Evaluation:  02/01/2023 Referral Source: primary care and therapist Chief Complaint:   Chief Complaint  Patient presents with   Anxiety   Establish Care   Visit Diagnosis:    ICD-10-CM   1. Generalized abdominal pain  R10.84     2. MDD (major depressive disorder), recurrent episode, mild (HCC)  F33.0      Virtual Visit via Video Note  I connected with Jennifer Bell on 02/01/23 at  9:30 AM EDT by a video enabled telemedicine application and verified that I am speaking with the correct person using two identifiers.  Location: Patient: parked car Provider: home office   I discussed the limitations of evaluation and management by telemedicine and the availability of in person appointments. The patient expressed understanding and agreed to proceed.      I discussed the assessment and treatment plan with the patient. The patient was provided an opportunity to ask questions and all were answered. The patient agreed with the plan and demonstrated an understanding of the instructions.   The patient was advised to call back or seek an in-person evaluation if the symptoms worsen or if the condition fails to improve as anticipated.  I provided 60 minutes of non-face-to-face time during this encounter.   History of Present Illness: Patient is a 32 years old currently married Caucasian female who works as a Art gallery manager she has 2 kids aged 7 and 4 referred by her therapist Harriett Sine and primary care physician to establish care with history of anxiety  Patient states she has suffered from anxiety for a long time and has suffered from depression 7 years ago as postpartum she did not started medication because her family does not believe in medication.  She suffered again postpartum depression 4-1/2 years ago when her daughter was born she was started sertraline that was  slowly gradually increased to 100 mg it did help it helped her anxiety otherwise she was feeling excessive worries and reasonable worries also there were some challenges at family stressors.  At a dose of 100 mg she felt numb so she has taken down medication to 50 mg.  She is endorsing worries excessive worries worries about what will happen to her husband and she is worried about dying she is worried about health and in general at times feels excessive worries or unreasonable free-floating anxiety  She also endorses depressive days sometimes decreased interest withdrawn sadness crying spells.  She feels guilt she is a Engineer, site but does not have much time when she goes back home if she is able to give enough time to her kids she feels she dwells on the worries and over the generalized and that makes her tense she has had palpitations before and now she is on beta-blocker that is helping the palpitations  There is no associated psychotic symptoms manic symptoms currently or in the past  She has tried Prozac it made her worse  She does not want to be on medication that can cause weight gain   Modifying factors: marriage, kids, breathing, running  Duration for more then 8 years  Severity anxious, subdued  Hospital admission denies  Suicide history denies    Past Psychiatric History: anxiety, depression, post partum  Previous Psychotropic Medications: No   Substance Abuse History in the last 12 months:  No.  Consequences of Substance Abuse: NA  Past Medical History:  Past Medical  History:  Diagnosis Date   Anxiety    Barrett esophagus    GERD (gastroesophageal reflux disease)    Heart palpitations    Hiatal hernia     Past Surgical History:  Procedure Laterality Date   MYRINGOTOMY WITH TUBE PLACEMENT     TONSILLECTOMY     WISDOM TOOTH EXTRACTION      Family Psychiatric History: mom : bipolar  Family History:  Family History  Problem Relation Age of Onset    Vitamin D deficiency Mother    Diabetes Maternal Grandmother    Diabetes Maternal Grandfather    Lung cancer Maternal Grandfather    Colon cancer Maternal Grandfather    Non-Hodgkin's lymphoma Paternal Grandfather    Depression Paternal Aunt     Social History:   Social History   Socioeconomic History   Marital status: Married    Spouse name: Not on file   Number of children: 0   Years of education: Not on file   Highest education level: Not on file  Occupational History   Occupation: Magazine features editor: [employer]    Comment: [employer]  Tobacco Use   Smoking status: Never   Smokeless tobacco: Never  Vaping Use   Vaping Use: Never used  Substance and Sexual Activity   Alcohol use: No   Drug use: No   Sexual activity: Yes    Birth control/protection: None  Other Topics Concern   Not on file  Social History Narrative   Not on file   Social Determinants of Health   Financial Resource Strain: Not on file  Food Insecurity: Not on file  Transportation Needs: Not on file  Physical Activity: Not on file  Stress: Not on file  Social Connections: Not on file    Additional Social History: grew up with parents, mom and dad would fight, difficult environment   Allergies:   Allergies  Allergen Reactions   Amoxicillin-Pot Clavulanate Other (See Comments)    Abdominal Pain    Metabolic Disorder Labs: Lab Results  Component Value Date   HGBA1C 5.0 07/22/2022   MPG 97 07/22/2022   MPG 94 03/02/2017   No results found for: "PROLACTIN" Lab Results  Component Value Date   CHOL 162 07/22/2022   TRIG 60 07/22/2022   HDL 60 07/22/2022   CHOLHDL 2.7 07/22/2022   VLDL 8 12/16/2014   LDLCALC 87 07/22/2022   LDLCALC 71 12/16/2014   Lab Results  Component Value Date   TSH 1.270 09/22/2022    Therapeutic Level Labs: No results found for: "LITHIUM" No results found for: "CBMZ" No results found for: "VALPROATE"  Current  Medications: Current Outpatient Medications  Medication Sig Dispense Refill   chlorpheniramine-HYDROcodone (TUSSIONEX) 10-8 MG/5ML Take 5 mLs by mouth every 12 (twelve) hours as needed for cough (cough, will cause drowsiness.). (Patient not taking: Reported on 12/21/2022) 120 mL 0   hydrOXYzine (ATARAX) 10 MG tablet Take 1 tablet (10 mg total) by mouth 3 (three) times daily as needed for itching. (Patient not taking: Reported on 12/21/2022) 30 tablet 0   metoprolol succinate (TOPROL-XL) 25 MG 24 hr tablet Take 1 tablet (25 mg total) by mouth daily. 90 tablet 3   ondansetron (ZOFRAN) 8 MG tablet Take 1 tablet (8 mg total) by mouth every 8 (eight) hours as needed for nausea or vomiting. 20 tablet 0   predniSONE (DELTASONE) 50 MG tablet One tab PO daily for 5 days. (Patient not taking: Reported on 12/21/2022)  5 tablet 0   sertraline (ZOLOFT) 50 MG tablet Take 1 tablet (50 mg total) by mouth daily. 90 tablet 3   topiramate (TOPAMAX) 50 MG tablet One half tab p.o. nightly for 1 week then 1 tab p.o. nightly for 1 week then 1 tab p.o. twice daily (Patient not taking: Reported on 12/21/2022) 60 tablet 3   No current facility-administered medications for this visit.     Psychiatric Specialty Exam: Review of Systems  There were no vitals taken for this visit.There is no height or weight on file to calculate BMI.  General Appearance: Casual  Eye Contact:  Fair  Speech:  Clear and Coherent  Volume:  Normal  Mood:  Anxious  Affect:  Congruent  Thought Process:  Goal Directed  Orientation:  Full (Time, Place, and Person)  Thought Content:  Rumination  Suicidal Thoughts:  No  Homicidal Thoughts:  No  Memory:  Immediate;   Fair  Judgement:  Fair  Insight:  Fair  Psychomotor Activity:  Normal  Concentration:  Concentration: Fair  Recall:  Fair  Fund of Knowledge:Good  Language: Fair  Akathisia:  No  Handed:    AIMS (if indicated):  not done  Assets:  Desire for Improvement Physical Health   ADL's:  Intact  Cognition: WNL  Sleep:  Fair   Screenings: GAD-7    Flowsheet Row Office Visit from 03/21/2022 in [facility] Primary Care & Sports Medicine at [facility] Office Visit from 01/17/2022 in [facility] Primary Care & Sports Medicine at [facility] Office Visit from 09/28/2021 in [facility] Primary Care & Sports Medicine at [facility] Office Visit from 06/09/2020 in [facility] Primary Care & Sports Medicine at [facility] Office Visit from 08/11/2016 in [facility] Health Outpatient Behavioral Health at [facility]  Total GAD-7 Score 5 7 12 6 20       PHQ2-9    Flowsheet Row Video Visit from 02/01/2023 in [facility] Health Outpatient Behavioral Health at [facility] Office Visit from 11/22/2022 in [facility] Primary Care & Sports Medicine at [facility] Office Visit from 03/21/2022 in [facility] Primary Care & Sports Medicine at [facility] Office Visit from 01/17/2022 in [facility] Primary Care & Sports Medicine at [facility] Office Visit from 11/02/2021 in [facility] Primary Care & Sports Medicine at [facility] Total Score 2 0 0 2 1  PHQ-9 Total Score 9 -- 6 14 9        Assessment and Plan: as follows   Generalized anxiety disorder; 100 mg made her numb we will increase from 50 to 75 mg that is Zoloft she does have refills as of now.  She also notices that during holidays or summer break she does do better since she is not having the job stress so hopefully it is expected that things may get better in the next 1 month continue therapy  Also discussed to have an anxiety or worry time that she can worry about and after that time was over to not dwell on the worries to the next day.  Major depressive disorder mild; increase sertraline from 50 to 75 mg continue therapy  Discussed to distract from negative thoughts add activities to distract and to trace the things which she  has to create more positivity around herself  Follow-up in 1 month or earlier if needed reviewed medications questions addressed side effects discussed  Collaboration of Care: Other therapist, notes and chart of primary care reviewed  Patient/Guardian was advised  Release of Information must be obtained prior to any record release in order to collaborate their care with an outside provider. Patient/Guardian was advised if they have not already done so to contact the registration department to sign all necessary forms in order for [place] to release information regarding their care.   Consent: Patient/Guardian gives verbal consent for treatment and assignment of benefits for services provided during this visit. Patient/Guardian expressed understanding and agreed to proceed.   Thresa Ross, MD 5/1/20249:43 AM

## 2023-02-07 ENCOUNTER — Telehealth: Payer: Self-pay | Admitting: Family Medicine

## 2023-02-07 ENCOUNTER — Ambulatory Visit
Admission: RE | Admit: 2023-02-07 | Discharge: 2023-02-07 | Disposition: A | Discharge status: Home / Self Care | Payer: 59 | Source: Ambulatory Visit | Attending: Physician Assistant | Admitting: Physician Assistant

## 2023-02-07 VITALS — BP 104/72 | HR 73 | Temp 97.9°F | Resp 19

## 2023-02-07 DIAGNOSIS — H66001 Acute suppurative otitis media without spontaneous rupture of ear drum, right ear: Secondary | ICD-10-CM | POA: Diagnosis not present

## 2023-02-07 DIAGNOSIS — J0100 Acute maxillary sinusitis, unspecified: Secondary | ICD-10-CM

## 2023-02-07 MED ORDER — CEFDINIR 300 MG PO CAPS: 300.0000 mg | ORAL_CAPSULE | Freq: Two times a day (BID) | ORAL | 0 refills | Status: DC

## 2023-02-07 NOTE — ED Triage Notes (Signed)
Pt presents to [facility] with co of chest congestion for one week with new onset of otalgia and sore throat 2 days. Pt reports otc allergy meds and sudafed

## 2023-02-07 NOTE — Discharge Instructions (Addendum)
Take cefdinir twice daily for 7 days.  Take this with food as it can upset your stomach (as can oral antibiotics).  Continue Allegra and Flonase daily.  I also recommend nasal saline and sinus rinses.  If your symptoms or not improving within a week please return for reevaluation.  If you have any worsening symptoms including fever, worsening ear pain, drainage from the ear, nausea, vomiting, shortness of breath, severe cough you should be seen immediately.

## 2023-02-07 NOTE — ED Provider Notes (Signed)
[name]    CSN: [id number] Arrival date & time: 02/07/23  1604      History   Chief Complaint Chief Complaint  Patient presents with   Ear Fullness   Sore Throat   chest congestion    HPI Jennifer Bell is a 32 y.o. female.   Patient presents today with a weeklong history of URI symptoms including nasal congestion, sinus pressure, cough.  Reports over the past 24 hours she has developed right otalgia that is rated 5/6 on a 0-10 scale, described as aching, no aggravating or alleviating factors identified.  She has been taking allergy medication including Allegra, Benadryl, Sudafed without improvement of symptoms.  She is also tried ibuprofen with minimal improvement.  She does have a history of seasonal allergies typically managed with these medications.  She denies any known sick contacts.  She does have a history of ear infections when she was much younger and had to have tubes when she was a kid but has not had issues in adulthood.  She denies any recent antibiotics in the past 90 days.  She is confident that she is not pregnant.    Past Medical History:  Diagnosis Date   Anxiety    Barrett esophagus    GERD (gastroesophageal reflux disease)    Heart palpitations    Hiatal hernia     Patient Active Problem List   Diagnosis Date Noted   Dysuria 12/21/2022   Nausea and vomiting 12/21/2022   Influenza B 11/22/2022   Abnormal weight gain 01/17/2022   Palpitations 09/28/2021   GAD (generalized anxiety disorder) 08/15/2016   Generalized abdominal pain 05/28/2014   Hiatal hernia 05/28/2014    Past Surgical History:  Procedure Laterality Date   MYRINGOTOMY WITH TUBE PLACEMENT     TONSILLECTOMY     WISDOM TOOTH EXTRACTION      OB History     Gravida  2   Para  2   Term  2   Preterm      AB      Living  1      SAB      IAB      Ectopic      Multiple  0   Live Births  2            Home Medications    Prior to Admission  medications   Medication Sig Start Date End Date Taking? Authorizing Provider  cefdinir (OMNICEF) 300 MG capsule Take 1 capsule (300 mg total) by mouth 2 (two) times daily. 02/07/23  Yes Gavinn Collard K, PA-C  chlorpheniramine-HYDROcodone (TUSSIONEX) 10-8 MG/5ML Take 5 mLs by mouth every 12 (twelve) hours as needed for cough (cough, will cause drowsiness.). Patient not taking: Reported on 12/21/2022 11/22/22   Monica Becton, MD  hydrOXYzine (ATARAX) 10 MG tablet Take 1 tablet (10 mg total) by mouth 3 (three) times daily as needed for itching. Patient not taking: Reported on 12/21/2022 09/22/22   Suzan Slick, MD  metoprolol succinate (TOPROL-XL) 25 MG 24 hr tablet Take 1 tablet (25 mg total) by mouth daily. 06/15/22   Monica Becton, MD  ondansetron (ZOFRAN) 8 MG tablet Take 1 tablet (8 mg total) by mouth every 8 (eight) hours as needed for nausea or vomiting. 12/21/22   Novella Olive, FNP  predniSONE (DELTASONE) 50 MG tablet One tab PO daily for 5 days. Patient not taking: Reported on 12/21/2022 11/22/22   Monica Becton, MD  sertraline (  ZOLOFT) 50 MG tablet Take 1.5 tablets (75 mg total) by mouth daily. 02/01/23   Thresa Ross, MD  topiramate (TOPAMAX) 50 MG tablet One half tab p.o. nightly for 1 week then 1 tab p.o. nightly for 1 week then 1 tab p.o. twice daily Patient not taking: Reported on 12/21/2022 06/15/22   Monica Becton, MD    Family History Family History  Problem Relation Age of Onset   Vitamin D deficiency Mother    Diabetes Maternal Grandmother    Diabetes Maternal Grandfather    Lung cancer Maternal Grandfather    Colon cancer Maternal Grandfather    Non-Hodgkin's lymphoma Paternal Grandfather    Depression Paternal Aunt     Social History Social History   Tobacco Use   Smoking status: Never   Smokeless tobacco: Never  Vaping Use   Vaping Use: Never used  Substance Use Topics   Alcohol use: No   Drug use: No     Allergies    Amoxicillin-pot clavulanate   Review of Systems Review of Systems  Constitutional:  Positive for activity change. Negative for appetite change, fatigue and fever.  HENT:  Positive for congestion, ear pain and sinus pressure. Negative for sneezing and sore throat.   Respiratory:  Positive for cough. Negative for shortness of breath.   Cardiovascular:  Negative for chest pain.  Gastrointestinal:  Negative for abdominal pain, diarrhea, nausea and vomiting.  Neurological:  Negative for dizziness, light-headedness and headaches.     Physical Exam Triage Vital Signs ED Triage Vitals [02/07/23 1623]  Enc Vitals Group     BP 104/72     Pulse Rate 73     Resp 19     Temp 97.9 F (36.6 C)     Temp Source Oral     SpO2 98 %     Weight      Height      Head Circumference      Peak Flow      Pain Score      Pain Loc      Pain Edu?      Excl. in GC?    No data found.  Updated Vital Signs BP 104/72   Pulse 73   Temp 97.9 F (36.6 C) (Oral)   Resp 19   LMP 01/24/2023 (Approximate)   SpO2 98%   Visual Acuity Right Eye Distance:   Left Eye Distance:   Bilateral Distance:    Right Eye Near:   Left Eye Near:    Bilateral Near:     Physical Exam Vitals reviewed.  Constitutional:      General: She is awake. She is not in acute distress.    Appearance: Normal appearance. She is well-developed. She is not ill-appearing.     Comments: Very pleasant female appears stated age in no acute distress sitting comfortably in exam room  HENT:     Head: Normocephalic and atraumatic.     Right Ear: Ear canal and external ear normal. Tympanic membrane is erythematous and bulging.     Left Ear: Tympanic membrane, ear canal and external ear normal. Tympanic membrane is not erythematous or bulging.     Nose:     Right Sinus: Maxillary sinus tenderness present. No frontal sinus tenderness.     Left Sinus: Maxillary sinus tenderness present. No frontal sinus tenderness.     Mouth/Throat:      Pharynx: Uvula midline. Posterior oropharyngeal erythema present. No oropharyngeal exudate.     Comments:  Erythema and drainage in posterior oropharynx Cardiovascular:     Rate and Rhythm: Normal rate and regular rhythm.     Heart sounds: Normal heart sounds, S1 normal and S2 normal. No murmur heard. Pulmonary:     Effort: Pulmonary effort is normal.     Breath sounds: Normal breath sounds. No wheezing, rhonchi or rales.     Comments: Clear to auscultation bilaterally Psychiatric:        Behavior: Behavior is cooperative.      UC Treatments / Results  Labs (all labs ordered are listed, but only abnormal results are displayed) Labs Reviewed - No data to display  EKG   Radiology No results found.  Procedures Procedures (including critical care time)  Medications Ordered in UC Medications - No data to display  Initial Impression / Assessment and Plan / UC Course  I have reviewed the triage vital signs and the nursing notes.  Pertinent labs & imaging results that were available during my care of the patient were reviewed by me and considered in my medical decision making (see chart for details).     Patient is well-appearing, afebrile, nontoxic, nontachycardic.  Viral testing was deferred as she has been symptomatic for over a week and this would not change management.  Otitis media identified on physical exam.  Will start Omnicef twice daily for 7 days.  Patient does have allergy listed for Augmentin but this is described as severe diarrhea and abdominal upset to more of an intolerance than a true allergy.  She was encouraged to use over-the-counter medication including Tylenol ibuprofen as well as previously recommended allergy medicines.  She is to rest and drink plenty of fluid.  Discussed that if her symptoms or not improving quickly she should return for reevaluation.  If she has any worsening symptoms including fever, worsening otalgia, otorrhea, nausea, vomiting,  cough, shortness of breath she needs to be seen immediately.  Strict return precautions given.  Work excuse note provided.  Final Clinical Impressions(s) / UC Diagnoses   Final diagnoses:  Non-recurrent acute suppurative otitis media of right ear without spontaneous rupture of tympanic membrane  Acute non-recurrent maxillary sinusitis     Discharge Instructions      Take cefdinir twice daily for 7 days.  Take this with food as it can upset your stomach (as can oral antibiotics).  Continue Allegra and Flonase daily.  I also recommend nasal saline and sinus rinses.  If your symptoms or not improving within a week please return for reevaluation.  If you have any worsening symptoms including fever, worsening ear pain, drainage from the ear, nausea, vomiting, shortness of breath, severe cough you should be seen immediately.     ED Prescriptions     Medication Sig Dispense Auth. Provider   cefdinir (OMNICEF) 300 MG capsule Take 1 capsule (300 mg total) by mouth 2 (two) times daily. 14 capsule Tildon Silveria K, PA-C      PDMP not reviewed this encounter.   Jeani Hawking, PA-C 02/07/23 1651

## 2023-02-20 ENCOUNTER — Encounter: Payer: Self-pay | Admitting: Sports Medicine

## 2023-02-28 ENCOUNTER — Ambulatory Visit
Admission: RE | Admit: 2023-02-28 | Discharge: 2023-02-28 | Disposition: A | Discharge status: Home / Self Care | Payer: 59 | Source: Ambulatory Visit | Attending: Family Medicine | Admitting: Family Medicine

## 2023-02-28 VITALS — BP 113/74 | HR 63 | Temp 98.2°F | Resp 18

## 2023-02-28 DIAGNOSIS — R21 Rash and other nonspecific skin eruption: Secondary | ICD-10-CM | POA: Diagnosis not present

## 2023-02-28 DIAGNOSIS — H6690 Otitis media, unspecified, unspecified ear: Secondary | ICD-10-CM

## 2023-02-28 MED ORDER — METHYLPREDNISOLONE SODIUM SUCC 125 MG IJ SOLR
125.0000 mg | Freq: Once | INTRAMUSCULAR | Status: AC
  Administered 2023-02-28: 125 mg via INTRAMUSCULAR

## 2023-02-28 MED ORDER — AMOXICILLIN 875 MG PO TABS: 875.0000 mg | ORAL_TABLET | Freq: Two times a day (BID) | ORAL | 0 refills | Status: DC

## 2023-02-28 MED ORDER — PREDNISONE 10 MG (21) PO TBPK: ORAL_TABLET | Freq: Every day | ORAL | 0 refills | Status: DC

## 2023-02-28 NOTE — Discharge Instructions (Addendum)
Advised patient to take medications as directed with food to completion.  Encouraged increase daily water intake to 64 ounces per day while taking these medications.  Advised patient if symptoms worsen and/or unresolved please follow-up with ENT, PCP, or here for further evaluation.

## 2023-02-28 NOTE — ED Triage Notes (Signed)
Pt c/o RT ear pain x 2 weeks. Was already tx for ear infection 10 days ago. Also having hives since last Wed. Denies new detergents, lotions and foods. Benedryl, allegra prn. Hx of seasonal allergies.

## 2023-02-28 NOTE — ED Provider Notes (Signed)
[name]    CSN: [id number] Arrival date & time: 02/28/23  1601      History   Chief Complaint Chief Complaint  Patient presents with   Otalgia    RT   Urticaria    HPI Jennifer Bell is a 32 y.o. female.   HPI 32 year old female presents with right ear pain for 2 weeks patient reports was treated for ear infection 10 days ago with cefdinir and right ear pain is still present..  Additionally, patient reports hives/rash of body for the past 5 to 6 days.  PMH significant for GERD, palpitations, and anxiety.  Past Medical History:  Diagnosis Date   Anxiety    Barrett esophagus    GERD (gastroesophageal reflux disease)    Heart palpitations    Hiatal hernia     Patient Active Problem List   Diagnosis Date Noted   Dysuria 12/21/2022   Nausea and vomiting 12/21/2022   Influenza B 11/22/2022   Abnormal weight gain 01/17/2022   Palpitations 09/28/2021   GAD (generalized anxiety disorder) 08/15/2016   Generalized abdominal pain 05/28/2014   Hiatal hernia 05/28/2014    Past Surgical History:  Procedure Laterality Date   MYRINGOTOMY WITH TUBE PLACEMENT     TONSILLECTOMY     Jennifer TOOTH EXTRACTION      OB History     Gravida  2   Para  2   Term  2   Preterm      AB      Living  1      SAB      IAB      Ectopic      Multiple  0   Live Births  2            Home Medications    Prior to Admission medications   Medication Sig Start Date End Date Taking? Authorizing Provider  amoxicillin (AMOXIL) 875 MG tablet Take 1 tablet (875 mg total) by mouth 2 (two) times daily for 10 days. 02/28/23 03/10/23 Yes Jennifer Iha, FNP  predniSONE (STERAPRED UNI-PAK 21 TAB) 10 MG (21) TBPK tablet Take by mouth daily. Take 6 tabs by mouth daily  for 2 days, then 5 tabs for 2 days, then 4 tabs for 2 days, then 3 tabs for 2 days, 2 tabs for 2 days, then 1 tab by mouth daily for 2 days 02/28/23  Yes Jennifer Iha, FNP  metoprolol succinate  (TOPROL-XL) 25 MG 24 hr tablet Take 1 tablet (25 mg total) by mouth daily. 06/15/22   Jennifer Becton, MD  sertraline (ZOLOFT) 50 MG tablet Take 1.5 tablets (75 mg total) by mouth daily. 02/01/23   Jennifer Ross, MD  topiramate (TOPAMAX) 50 MG tablet One half tab p.o. nightly for 1 week then 1 tab p.o. nightly for 1 week then 1 tab p.o. twice daily Patient not taking: Reported on 12/21/2022 06/15/22   Jennifer Becton, MD    Family History Family History  Problem Relation Age of Onset   Vitamin D deficiency Mother    Diabetes Maternal Grandmother    Diabetes Maternal Grandfather    Lung cancer Maternal Grandfather    Colon cancer Maternal Grandfather    Non-Hodgkin's lymphoma Paternal Grandfather    Depression Paternal Aunt     Social History Social History   Tobacco Use   Smoking status: Never   Smokeless tobacco: Never  Vaping Use   Vaping Use: Never used  Substance Use Topics  Alcohol use: No   Drug use: No     Allergies   Amoxicillin-pot clavulanate   Review of Systems Review of Systems  HENT:  Positive for ear pain.   Skin:  Positive for rash.  All other systems reviewed and are negative.    Physical Exam Triage Vital Signs ED Triage Vitals [02/28/23 1610]  Enc Vitals Group     BP 113/74     Pulse Rate 63     Resp 18     Temp 98.2 F (36.8 C)     Temp Source Oral     SpO2 98 %     Weight      Height      Head Circumference      Peak Flow      Pain Score 4     Pain Loc      Pain Edu?      Excl. in GC?    No data found.  Updated Vital Signs BP 113/74 (BP Location: Right Arm)   Pulse 63   Temp 98.2 F (36.8 C) (Oral)   Resp 18   LMP 01/24/2023 (Approximate)   SpO2 98%     Physical Exam Vitals and nursing note reviewed.  Constitutional:      Appearance: Normal appearance. She is normal weight.  HENT:     Head: Normocephalic and atraumatic.     Right Ear: External ear normal.     Left Ear: External ear normal.     Ears:      Comments: Right TM: Red rimmed, retracted    Mouth/Throat:     Mouth: Mucous membranes are moist.     Pharynx: Oropharynx is clear.  Eyes:     Extraocular Movements: Extraocular movements intact.     Conjunctiva/sclera: Conjunctivae normal.     Pupils: Pupils are equal, round, and reactive to light.  Cardiovascular:     Rate and Rhythm: Normal rate and regular rhythm.     Pulses: Normal pulses.     Heart sounds: Normal heart sounds.  Pulmonary:     Effort: Pulmonary effort is normal.     Breath sounds: Normal breath sounds. No wheezing, rhonchi or rales.  Musculoskeletal:        General: Normal range of motion.     Cervical back: Normal range of motion and neck supple.  Skin:    General: Skin is warm and dry.     Comments: Bilateral lower arms (volar aspects): Pruritic mildly erythematous maculopapular eruption-please see image below  Neurological:     General: No focal deficit present.     Mental Status: She is alert and oriented to person, place, and time. Mental status is at baseline.  Psychiatric:        Mood and Affect: Mood normal.        Behavior: Behavior normal.        Thought Content: Thought content normal.      UC Treatments / Results  Labs (all labs ordered are listed, but only abnormal results are displayed) Labs Reviewed - No data to display  EKG   Radiology No results found.  Procedures Procedures (including critical care time)  Medications Ordered in UC Medications  methylPREDNISolone sodium succinate (SOLU-MEDROL) 125 mg/2 mL injection 125 mg (125 mg Intramuscular Given 02/28/23 1642)    Initial Impression / Assessment and Plan / UC Course  I have reviewed the triage vital signs and the nursing notes.  Pertinent labs & imaging results that were  available during my care of the patient were reviewed by me and considered in my medical decision making (see chart for details).     MDM: 1.  Acute otitis media, unspecified otitis media type-Rx'd  Amoxicillin 875 mg tablet twice daily x 10 days patient reports taking this medication previously without any adverse reactions. 2.  Rash and nonspecific skin eruption-IM Solu-Medrol 125 mg given once in clinic and prior to discharge, Rx'd Sterapred Unipak. Advised patient to take medications as directed with food to completion.  Encouraged increase daily water intake to 64 ounces per day while taking these medications.  Advised patient if symptoms worsen and/or unresolved please follow-up with ENT, PCP, or here for further evaluation.  Patient discharged home, hemodynamically stable. Final Clinical Impressions(s) / UC Diagnoses   Final diagnoses:  Acute otitis media, unspecified otitis media type  Rash and nonspecific skin eruption     Discharge Instructions      Advised patient to take medications as directed with food to completion.  Encouraged increase daily water intake to 64 ounces per day while taking these medications.  Advised patient if symptoms worsen and/or unresolved please follow-up with ENT, PCP, or here for further evaluation.     ED Prescriptions     Medication Sig Dispense Auth. Provider   predniSONE (STERAPRED UNI-PAK 21 TAB) 10 MG (21) TBPK tablet Take by mouth daily. Take 6 tabs by mouth daily  for 2 days, then 5 tabs for 2 days, then 4 tabs for 2 days, then 3 tabs for 2 days, 2 tabs for 2 days, then 1 tab by mouth daily for 2 days 42 tablet Jennifer Iha, FNP   amoxicillin (AMOXIL) 875 MG tablet Take 1 tablet (875 mg total) by mouth 2 (two) times daily for 10 days. 20 tablet Jennifer Iha, FNP      PDMP not reviewed this encounter.   Jennifer Iha, FNP 02/28/23 1658

## 2023-03-02 ENCOUNTER — Ambulatory Visit: Payer: 59 | Admitting: Sports Medicine

## 2023-03-07 ENCOUNTER — Ambulatory Visit: Payer: 59 | Admitting: Family Medicine

## 2023-03-07 ENCOUNTER — Encounter: Payer: Self-pay | Admitting: Family Medicine

## 2023-03-07 VITALS — BP 99/65 | HR 78 | Ht 66.0 in | Wt 150.0 lb

## 2023-03-07 DIAGNOSIS — N644 Mastodynia: Secondary | ICD-10-CM | POA: Insufficient documentation

## 2023-03-07 DIAGNOSIS — L501 Idiopathic urticaria: Secondary | ICD-10-CM | POA: Diagnosis not present

## 2023-03-07 DIAGNOSIS — F411 Generalized anxiety disorder: Secondary | ICD-10-CM | POA: Diagnosis not present

## 2023-03-07 MED ORDER — ESCITALOPRAM OXALATE 10 MG PO TABS: 10.0000 mg | ORAL_TABLET | Freq: Every day | ORAL | 2 refills | Status: DC

## 2023-03-07 MED ORDER — CETIRIZINE HCL 10 MG PO TABS: 10.0000 mg | ORAL_TABLET | Freq: Every day | ORAL | 2 refills | Status: AC

## 2023-03-07 NOTE — Progress Notes (Signed)
Established patient visit   Patient: Jennifer Bell   DOB: 1991-09-28   32 y.o. Female  MRN: [id number] Visit Date: 03/07/2023  Today's healthcare provider: Charlton Amor, DO   Chief Complaint  Patient presents with   whelps/ bumps on  back , buttocks and left wrist     Sites are very pruritic and warm to touch - coming and going x 2 weeks - was given steroid injection  at urgent care x 1 week ago but does not feel this has helped  and did not start home steroids prescribed by urgent care.  Patient was also given Amoxicillin for ear pain at urgent care visit- has completed this.    tenderness breast    Patient c/o tenderness and possible lump  left side just behind left breast and left axillae  x couple of months.     SUBJECTIVE    Chief Complaint  Patient presents with   whelps/ bumps on  back , buttocks and left wrist     Sites are very pruritic and warm to touch - coming and going x 2 weeks - was given steroid injection  at urgent care x 1 week ago but does not feel this has helped  and did not start home steroids prescribed by urgent care.  Patient was also given Amoxicillin for ear pain at urgent care visit- has completed this.    tenderness breast    Patient c/o tenderness and possible lump  left side just behind left breast and left axillae  x couple of months.    HPI  Pt presents for concerns of hive like rash over body. She was seen in urgent care and given a steroid injection as well as a prescription. She was worried to take the prescription so she didn't finish the steroids.   She also notes some L breast tenderness. Has not noted any other breast changes.   Pt also notes an increase in anxiety. She is on zoloft but is unsure if this is working for her.  Review of Systems  Constitutional:  Negative for activity change, fatigue and fever.  Respiratory:  Negative for cough and shortness of breath.   Cardiovascular:  Negative for chest pain.  Gastrointestinal:   Negative for abdominal pain.  Genitourinary:  Negative for difficulty urinating.  Skin:        hives       Current Meds  Medication Sig   cetirizine (ZYRTEC) 10 MG tablet Take 1 tablet (10 mg total) by mouth daily.   escitalopram (LEXAPRO) 10 MG tablet Take 1 tablet (10 mg total) by mouth daily.   metoprolol succinate (TOPROL-XL) 25 MG 24 hr tablet Take 1 tablet (25 mg total) by mouth daily.   topiramate (TOPAMAX) 50 MG tablet One half tab p.o. nightly for 1 week then 1 tab p.o. nightly for 1 week then 1 tab p.o. twice daily   [DISCONTINUED] sertraline (ZOLOFT) 50 MG tablet Take 1.5 tablets (75 mg total) by mouth daily.    OBJECTIVE    BP 99/65   Pulse 78   Ht 5\' 6"  (1.676 m)   Wt 150 lb (68 kg)   LMP 01/24/2023 (Approximate)   SpO2 100%   BMI 24.21 kg/m   Physical Exam Vitals and nursing note reviewed.  Constitutional:      General: She is not in acute distress.    Appearance: Normal appearance.  HENT:     Head: Normocephalic and atraumatic.  Right Ear: External ear normal.     Left Ear: External ear normal.     Nose: Nose normal.  Eyes:     Conjunctiva/sclera: Conjunctivae normal.  Cardiovascular:     Rate and Rhythm: Normal rate and regular rhythm.  Pulmonary:     Effort: Pulmonary effort is normal.     Breath sounds: Normal breath sounds.  Chest:     Comments: Breast exam done with chaperone in the room. Nipple inversion noted on left breast. No nipple discharge on breast bilaterally. No skin changes present Skin:    Comments: Urticaria present across body  Neurological:     General: No focal deficit present.     Mental Status: She is alert and oriented to person, place, and time.  Psychiatric:        Mood and Affect: Mood normal.        Behavior: Behavior normal.        Thought Content: Thought content normal.        Judgment: Judgment normal.        ASSESSMENT & PLAN    Problem List Items Addressed This Visit       Musculoskeletal and  Integument   Chronic idiopathic urticaria - Primary    - pt has urticaria in various areas on her arms/legs bilaterally that come and go. Says that she had this happen about 48mo ago and then it went away. Does have strong history of allergies  - recommended and ordered prescription of zyrtec  - pt has a prescription of prednisone from urgent care, recommended she take steroid taper  - pt has an allergist and we discussed having her reach out to them and get an apt         Other   GAD (generalized anxiety disorder)    - pt has hx of GAD and she was previously on zoloft however she feels like it never worked for her. We discussed anxiety and pt is open to changing ssris - we will switch to lexapro 10mg  and follow up in 4 weeks - discussed side effects of lexapro       Relevant Medications   escitalopram (LEXAPRO) 10 MG tablet   Breast pain    - breast exam done and nipple inversion of left breast. Tenderness to palpation of upper and outer quadrant of breast. No nipple discharge, no skin changes - will go ahead and order an US of the left breast as well as diagnostic mammogram to further investigate reason for patient have tenderness in breast      Relevant Orders   MM Digital Diagnostic Unilat L   [place] LIMITED ULTRASOUND INCLUDING AXILLA LEFT BREAST     Return in about 4 weeks (around 04/04/2023).      Meds ordered this encounter  Medications   cetirizine (ZYRTEC) 10 MG tablet    Sig: Take 1 tablet (10 mg total) by mouth daily.    Dispense:  30 tablet    Refill:  2   escitalopram (LEXAPRO) 10 MG tablet    Sig: Take 1 tablet (10 mg total) by mouth daily.    Dispense:  30 tablet    Refill:  2    Orders Placed This Encounter  Procedures   MM Digital Diagnostic Unilat L    Standing Status:   Future    Standing Expiration Date:   03/06/2024    Order Specific Question:   Reason for Exam (SYMPTOM  OR DIAGNOSIS REQUIRED)  Answer:   breast tenderness to palpation    Order Specific  Question:   Is the patient pregnant?    Answer:   No    Order Specific Question:   Preferred imaging location?    Answer:   [facility]   [place] LIMITED ULTRASOUND INCLUDING AXILLA LEFT BREAST     Standing Status:   Future    Standing Expiration Date:   03/06/2024    Order Specific Question:   Reason for Exam (SYMPTOM  OR DIAGNOSIS REQUIRED)    Answer:   left breast tenderness    Order Specific Question:   Preferred imaging location?    Answer:   [facility]     Charlton Amor, DO  [facility] at [facility] [phone] (phone) [phone] (fax)  [facility]

## 2023-03-07 NOTE — Assessment & Plan Note (Signed)
-   pt has urticaria in various areas on her arms/legs bilaterally that come and go. Says that she had this happen about 41mo ago and then it went away. Does have strong history of allergies  - recommended and ordered prescription of zyrtec  - pt has a prescription of prednisone from urgent care, recommended she take steroid taper  - pt has an allergist and we discussed having her reach out to them and get an apt

## 2023-03-07 NOTE — Assessment & Plan Note (Signed)
-   breast exam done and nipple inversion of left breast. Tenderness to palpation of upper and outer quadrant of breast. No nipple discharge, no skin changes - will go ahead and order an US of the left breast as well as diagnostic mammogram to further investigate reason for patient have tenderness in breast

## 2023-03-07 NOTE — Assessment & Plan Note (Signed)
-   pt has hx of GAD and she was previously on zoloft however she feels like it never worked for her. We discussed anxiety and pt is open to changing ssris - we will switch to lexapro 10mg  and follow up in 4 weeks - discussed side effects of lexapro

## 2023-03-09 ENCOUNTER — Encounter: Payer: Self-pay | Admitting: Family Medicine

## 2023-03-10 ENCOUNTER — Encounter: Payer: Self-pay | Admitting: Family Medicine

## 2023-03-10 ENCOUNTER — Other Ambulatory Visit: Payer: Self-pay | Admitting: Family Medicine

## 2023-03-10 DIAGNOSIS — L299 Pruritus, unspecified: Secondary | ICD-10-CM

## 2023-03-10 MED ORDER — HYDROXYZINE PAMOATE 25 MG PO CAPS
25.0000 mg | ORAL_CAPSULE | Freq: Three times a day (TID) | ORAL | 0 refills | Status: DC | PRN
Start: 2023-03-10 — End: 2023-08-02

## 2023-03-15 ENCOUNTER — Telehealth (HOSPITAL_COMMUNITY): Payer: 59 | Admitting: Psychiatry

## 2023-03-28 ENCOUNTER — Other Ambulatory Visit: Payer: Self-pay | Admitting: Sports Medicine

## 2023-03-28 DIAGNOSIS — F411 Generalized anxiety disorder: Secondary | ICD-10-CM

## 2023-03-29 ENCOUNTER — Other Ambulatory Visit: Payer: 59

## 2023-03-30 ENCOUNTER — Other Ambulatory Visit: Payer: Self-pay | Admitting: Family Medicine

## 2023-03-30 MED ORDER — BUPROPION HCL ER (XL) 150 MG PO TB24: 150.0000 mg | ORAL_TABLET | ORAL | 0 refills | Status: DC

## 2023-04-10 ENCOUNTER — Ambulatory Visit
Admission: RE | Admit: 2023-04-10 | Discharge: 2023-04-10 | Disposition: A | Discharge status: Home / Self Care | Payer: 59 | Source: Ambulatory Visit | Attending: Family Medicine | Admitting: Family Medicine

## 2023-04-10 DIAGNOSIS — N644 Mastodynia: Secondary | ICD-10-CM

## 2023-04-11 ENCOUNTER — Ambulatory Visit: Payer: 59 | Admitting: Sports Medicine

## 2023-04-11 ENCOUNTER — Encounter: Payer: Self-pay | Admitting: Sports Medicine

## 2023-04-11 VITALS — BP 110/70 | HR 77

## 2023-04-11 DIAGNOSIS — L501 Idiopathic urticaria: Secondary | ICD-10-CM | POA: Diagnosis not present

## 2023-04-11 DIAGNOSIS — N644 Mastodynia: Secondary | ICD-10-CM

## 2023-04-11 DIAGNOSIS — F411 Generalized anxiety disorder: Secondary | ICD-10-CM | POA: Diagnosis not present

## 2023-04-11 LAB — CBC WITH DIFFERENTIAL/PLATELET
MPV: 8.9 fL (ref 7.5–12.5)
Neutrophils Relative %: 61 %
Platelets: 260 10*3/uL (ref 140–400)

## 2023-04-11 NOTE — Progress Notes (Signed)
    Procedures performed today:    None.  Independent interpretation of notes and tests performed by another provider:   None.  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  Chronic idiopathic urticaria This pleasant 32 year old female saw my partner, just some urticaria, this occurred after an ear infection/viral syndrome.  She got prednisone, Zyrtec, she is better but still has some itching, she did show me pictures on the phone, and I agree this is urticaria. I think this is likely virally mediated, we are going to check a CBC with differential and IgE levels, this could potentially give [place] another tool to treat should this not resolve. I did inform her that this can take several months to fully get better.  GAD (generalized anxiety disorder) Tachyphylaxis with Zoloft, tried Lexapro but ineffective, switch to Wellbutrin, she does feel much better on Wellbutrin, she has improved focus, she feels like she can finally feel emotions, we will leave this alone for now, I would like a 4 to 6-week follow-up for dose titration if needed.    ____________________________________________ Ihor Austin. Benjamin Stain, M.D., ABFM., CAQSM., AME. Primary Care and Sports Medicine [place]  Adjunct Professor of Family Medicine  [employer]  Restaurant manager, fast food

## 2023-04-11 NOTE — Assessment & Plan Note (Signed)
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

## 2023-04-11 NOTE — Assessment & Plan Note (Addendum)
 This pleasant 32 year old female saw my partner, just some urticaria, this occurred after an ear infection/viral syndrome.  She got prednisone , Zyrtec , she is better but still has some itching, she did show me pictures on the phone, and I agree this is urticaria. I think this is likely virally mediated, we are going to check a CBC with differential and IgE levels, this could potentially give us  another tool to treat should this not resolve. I did inform her that this can take several months to fully get better.  Update: IgE level have come back elevated, due to the persistence of the urticaria and itching I would like her to touch base with allergy/immunology, they will probably recheck her IgE levels, this will also be a consult to determine if she is a candidate for Xolair treatment.

## 2023-04-11 NOTE — Assessment & Plan Note (Signed)
Tachyphylaxis with Zoloft, tried Lexapro but ineffective, switch to Wellbutrin, she does feel much better on Wellbutrin, she has improved focus, she feels like she can finally feel emotions, we will leave this alone for now, I would like a 4 to 6-week follow-up for dose titration if needed.

## 2023-04-12 LAB — CBC WITH DIFFERENTIAL/PLATELET
Absolute Monocytes: 441 {cells}/uL (ref 200–950)
Basophils Absolute: 49 {cells}/uL (ref 0–200)
Basophils Relative: 0.7 %
Eosinophils Absolute: 399 {cells}/uL (ref 15–500)
Eosinophils Relative: 5.7 %
HCT: 38 % (ref 35.0–45.0)
Hemoglobin: 13 g/dL (ref 11.7–15.5)
Lymphs Abs: 1841 {cells}/uL (ref 850–3900)
MCH: 29.8 pg (ref 27.0–33.0)
MCHC: 34.2 g/dL (ref 32.0–36.0)
MCV: 87.2 fL (ref 80.0–100.0)
Monocytes Relative: 6.3 %
Neutro Abs: 4270 {cells}/uL (ref 1500–7800)
RBC: 4.36 Million/uL (ref 3.80–5.10)
RDW: 11.8 % (ref 11.0–15.0)
Total Lymphocyte: 26.3 %
WBC: 7 10*3/uL (ref 3.8–10.8)

## 2023-04-12 LAB — IGE: IgE (Immunoglobulin E), Serum: 163 kU/L — ABNORMAL HIGH (ref <=114)

## 2023-04-13 NOTE — Addendum Note (Signed)
Addended by: Monica Becton on: 04/13/2023 12:41 PM   Modules accepted: Orders

## 2023-04-25 ENCOUNTER — Other Ambulatory Visit: Payer: Self-pay | Admitting: Family Medicine

## 2023-04-27 ENCOUNTER — Encounter: Payer: Self-pay | Admitting: Sports Medicine

## 2023-04-27 DIAGNOSIS — F411 Generalized anxiety disorder: Secondary | ICD-10-CM

## 2023-04-28 MED ORDER — BUPROPION HCL ER (XL) 300 MG PO TB24: 300.0000 mg | ORAL_TABLET | Freq: Every day | ORAL | 3 refills | Status: DC

## 2023-05-29 ENCOUNTER — Other Ambulatory Visit: Payer: Self-pay | Admitting: Family Medicine

## 2023-05-31 ENCOUNTER — Encounter (INDEPENDENT_AMBULATORY_CARE_PROVIDER_SITE_OTHER): Payer: 59 | Admitting: Sports Medicine

## 2023-05-31 DIAGNOSIS — H109 Unspecified conjunctivitis: Secondary | ICD-10-CM | POA: Insufficient documentation

## 2023-05-31 DIAGNOSIS — H1032 Unspecified acute conjunctivitis, left eye: Secondary | ICD-10-CM

## 2023-05-31 MED ORDER — ERYTHROMYCIN 5 MG/GM OP OINT: 1.0000 | TOPICAL_OINTMENT | Freq: Three times a day (TID) | OPHTHALMIC | 0 refills | Status: AC

## 2023-05-31 NOTE — Telephone Encounter (Signed)
I spent 5 total minutes of online digital evaluation and management services in this patient-initiated request for online care. 

## 2023-07-05 ENCOUNTER — Other Ambulatory Visit: Payer: Self-pay | Admitting: Sports Medicine

## 2023-07-05 DIAGNOSIS — R002 Palpitations: Secondary | ICD-10-CM

## 2023-07-18 ENCOUNTER — Encounter (INDEPENDENT_AMBULATORY_CARE_PROVIDER_SITE_OTHER): Payer: 59 | Admitting: Sports Medicine

## 2023-07-18 DIAGNOSIS — F411 Generalized anxiety disorder: Secondary | ICD-10-CM

## 2023-07-18 MED ORDER — BUPROPION HCL ER (XL) 150 MG PO TB24
150.0000 mg | ORAL_TABLET | Freq: Every day | ORAL | 3 refills | Status: DC
Start: 2023-07-18 — End: 2023-11-01

## 2023-07-18 NOTE — Telephone Encounter (Signed)

## 2023-08-02 ENCOUNTER — Ambulatory Visit
Admission: EM | Admit: 2023-08-02 | Discharge: 2023-08-02 | Disposition: A | Discharge status: Home / Self Care | Payer: BC Managed Care – PPO | Attending: Family Medicine | Admitting: Family Medicine

## 2023-08-02 ENCOUNTER — Encounter: Payer: Self-pay | Admitting: Emergency Medicine

## 2023-08-02 DIAGNOSIS — J0100 Acute maxillary sinusitis, unspecified: Secondary | ICD-10-CM

## 2023-08-02 DIAGNOSIS — R051 Acute cough: Secondary | ICD-10-CM | POA: Diagnosis not present

## 2023-08-02 DIAGNOSIS — H15102 Unspecified episcleritis, left eye: Secondary | ICD-10-CM

## 2023-08-02 MED ORDER — AZITHROMYCIN 250 MG PO TABS: ORAL_TABLET | ORAL | 0 refills | Status: DC

## 2023-08-02 MED ORDER — PREDNISONE 20 MG PO TABS: 20.0000 mg | ORAL_TABLET | Freq: Two times a day (BID) | ORAL | 0 refills | Status: DC

## 2023-08-02 MED ORDER — DICLOFENAC SODIUM 0.1 % OP SOLN: 1.0000 [drp] | Freq: Four times a day (QID) | OPHTHALMIC | 1 refills | Status: DC

## 2023-08-02 NOTE — ED Provider Notes (Signed)
[name]    CSN: [id number] Arrival date & time: 08/02/23  1637      History   Chief Complaint Chief Complaint  Patient presents with   Eye Problem    HPI Jennifer Bell is a 32 y.o. female.   HPI  Patient has 2 complaints today.  First she has had some eye redness in the left eye off and on for a month.  She has used some erythromycin ointment which is not helping her.  She would like to have this checked.  She also has what she feels is a sinus infection.  She states that she has been having headaches, sinus congestion, sore throat, some cough for 2 weeks.  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  Past Medical History:  Diagnosis Date   Anxiety    Barrett esophagus    GERD (gastroesophageal reflux disease)    Heart palpitations    Hiatal hernia     Patient Active Problem List   Diagnosis Date Noted   Conjunctivitis, left eye 05/31/2023   Chronic idiopathic urticaria with elevated IgE 03/07/2023   Breast pain 03/07/2023   Dysuria 12/21/2022   Nausea and vomiting 12/21/2022   Influenza B 11/22/2022   Abnormal weight gain 01/17/2022   Palpitations 09/28/2021   GAD (generalized anxiety disorder) 08/15/2016   Generalized abdominal pain 05/28/2014   Hiatal hernia 05/28/2014    Past Surgical History:  Procedure Laterality Date   MYRINGOTOMY WITH TUBE PLACEMENT     TONSILLECTOMY     WISDOM TOOTH EXTRACTION      OB History     Gravida  2   Para  2   Term  2   Preterm      AB      Living  1      SAB      IAB      Ectopic      Multiple  0   Live Births  2            Home Medications    Prior to Admission medications   Medication Sig Start Date End Date Taking? Authorizing Provider  azithromycin (ZITHROMAX Z-PAK) 250 MG tablet Take two pills today followed by one a day until gone 08/02/23  Yes Eustace Moore, MD  buPROPion  (WELLBUTRIN XL) 150 MG 24 hr tablet Take 1 tablet (150 mg total) by mouth daily. 07/18/23  Yes Monica Becton, MD  cetirizine (ZYRTEC) 10 MG tablet Take 1 tablet (10 mg total) by mouth daily. 03/07/23  Yes Morey Hummingbird S, DO  diclofenac (VOLTAREN) 0.1 % ophthalmic solution Place 1 drop into the left eye 4 (four) times daily. 08/02/23  Yes Eustace Moore, MD  metoprolol succinate (TOPROL-XL) 25 MG 24 hr tablet Take 1 tablet (25 mg total) by mouth daily. 07/05/23  Yes Monica Becton, MD  predniSONE (DELTASONE) 20 MG tablet Take 1 tablet (20 mg total) by mouth 2 (two) times daily with a meal. 08/02/23  Yes Eustace Moore, MD    Family History Family History  Problem Relation Age of Onset   Vitamin D deficiency Mother    Diabetes Maternal Grandmother    Diabetes Maternal Grandfather    Lung cancer Maternal Grandfather    Colon cancer Maternal Grandfather    Breast cancer Paternal Grandmother    Non-Hodgkin's lymphoma Paternal Grandfather  Depression Paternal Aunt     Social History Social History   Tobacco Use   Smoking status: Never   Smokeless tobacco: Never  Vaping Use   Vaping status: Never Used  Substance Use Topics   Alcohol use: No   Drug use: No     Allergies   Amoxicillin-pot clavulanate   Review of Systems Review of Systems See HPI  Physical Exam Triage Vital Signs ED Triage Vitals  Encounter Vitals Group     BP 08/02/23 1650 120/78     Systolic BP Percentile --      Diastolic BP Percentile --      Pulse Rate 08/02/23 1650 71     Resp 08/02/23 1650 16     Temp 08/02/23 1650 98.4 F (36.9 C)     Temp Source 08/02/23 1650 Oral     SpO2 08/02/23 1650 99 %     Weight 08/02/23 1652 150 lb (68 kg)     Height 08/02/23 1652 5\' 7"  (1.702 m)     Head Circumference --      Peak Flow --      Pain Score 08/02/23 1652 3     Pain Loc --      Pain Education --      Exclude from Growth Chart --    No data found.  Updated Vital Signs BP  120/78 (BP Location: Right Arm)   Pulse 71   Temp 98.4 F (36.9 C) (Oral)   Resp 16   Ht 5\' 7"  (1.702 m)   Wt 68 kg   LMP 07/13/2023 (Exact Date)   SpO2 99%   BMI 23.49 kg/m      Physical Exam Constitutional:      General: She is not in acute distress.    Appearance: She is well-developed and normal weight.  HENT:     Head: Normocephalic and atraumatic.     Right Ear: Tympanic membrane and ear canal normal.     Left Ear: Tympanic membrane and ear canal normal.     Nose: Congestion and rhinorrhea present.     Mouth/Throat:     Pharynx: Posterior oropharyngeal erythema present.     Comments: Nasal membranes are swollen and red.  Posterior pharynx injected.  Maxillary sinuses tender Eyes:     Conjunctiva/sclera: Conjunctivae normal.     Pupils: Pupils are equal, round, and reactive to light.   Cardiovascular:     Rate and Rhythm: Normal rate.  Pulmonary:     Effort: Pulmonary effort is normal. No respiratory distress.  Abdominal:     General: There is no distension.     Palpations: Abdomen is soft.  Musculoskeletal:        General: Normal range of motion.     Cervical back: Normal range of motion.  Skin:    General: Skin is warm and dry.  Neurological:     Mental Status: She is alert.      UC Treatments / Results  Labs (all labs ordered are listed, but only abnormal results are displayed) Labs Reviewed - No data to display  EKG   Radiology No results found.  Procedures Procedures (including critical care time)  Medications Ordered in UC Medications - No data to display  Initial Impression / Assessment and Plan / UC Course  I have reviewed the triage vital signs and the nursing notes.  Pertinent labs & imaging results that were available during my care of the patient were reviewed by me and considered  in my medical decision making (see chart for details).     Final Clinical Impressions(s) / UC Diagnoses   Final diagnoses:  Episcleritis of left eye   Acute non-recurrent maxillary sinusitis  Acute cough     Discharge Instructions      Make sure that you are drinking lots of water Take the azithromycin-antibiotic as directed.  2 pills today then 1 a day until gone Take prednisone twice a day for 5 days.  This will help with your cough and eye condition  Episcleritis is an inflammation of the eye.  Use the anti-inflammatory eyedrop (Voltaren) 4 times a day.  If you fail to improve you may need steroid eyedrops.  These are usually prescribed by an eye doctor.   ED Prescriptions     Medication Sig Dispense Auth. Provider   diclofenac (VOLTAREN) 0.1 % ophthalmic solution Place 1 drop into the left eye 4 (four) times daily. 5 mL Eustace Moore, MD   azithromycin (ZITHROMAX Z-PAK) 250 MG tablet Take two pills today followed by one a day until gone 6 tablet Eustace Moore, MD   predniSONE (DELTASONE) 20 MG tablet Take 1 tablet (20 mg total) by mouth 2 (two) times daily with a meal. 10 tablet Eustace Moore, MD      PDMP not reviewed this encounter.   Eustace Moore, MD 08/02/23 [phone]

## 2023-08-02 NOTE — Discharge Instructions (Signed)
Make sure that you are drinking lots of water Take the azithromycin-antibiotic as directed.  2 pills today then 1 a day until gone Take prednisone twice a day for 5 days.  This will help with your cough and eye condition  Episcleritis is an inflammation of the eye.  Use the anti-inflammatory eyedrop (Voltaren) 4 times a day.  If you fail to improve you may need steroid eyedrops.  These are usually prescribed by an eye doctor.

## 2023-08-02 NOTE — ED Triage Notes (Signed)
Patient c/o left eye redness on and off x 1 month, right ear pain and cough.  Patient's PCP did prescribe a cream for her left eye for pink eye but it did not help.  No drainage from eye, no crust around the eye.  When she gets up in the morning, her eye is extremely red but somewhat clears throughout the day.  Headaches off and on.  Patient has taken Ibuprofen and Delsym for cough.

## 2023-10-31 ENCOUNTER — Ambulatory Visit (INDEPENDENT_AMBULATORY_CARE_PROVIDER_SITE_OTHER): Payer: 59 | Admitting: Sports Medicine

## 2023-10-31 ENCOUNTER — Ambulatory Visit: Payer: 59 | Attending: Sports Medicine

## 2023-10-31 VITALS — BP 123/77 | HR 81 | Ht 67.0 in | Wt 147.0 lb

## 2023-10-31 DIAGNOSIS — F411 Generalized anxiety disorder: Secondary | ICD-10-CM

## 2023-10-31 DIAGNOSIS — R002 Palpitations: Secondary | ICD-10-CM | POA: Diagnosis not present

## 2023-10-31 MED ORDER — SERTRALINE HCL 25 MG PO TABS: 25.0000 mg | ORAL_TABLET | Freq: Every day | ORAL | 3 refills | Status: DC

## 2023-10-31 MED ORDER — LORAZEPAM 0.5 MG PO TABS: 0.5000 mg | ORAL_TABLET | Freq: Every day | ORAL | 0 refills | Status: DC | PRN

## 2023-10-31 NOTE — Progress Notes (Unsigned)
EP to read.

## 2023-10-31 NOTE — Assessment & Plan Note (Signed)
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 She did have a Zio patch monitor about 2 years ago that revealed some PVCs and PACs, a single 4 beat run of SVT at 150 beats a minute, occasional other scattered ectopy but predominantly normal sinus rhythm with sinus arrhythmias. She saw my partner and was placed on Toprol XL low-dose, and she did really well. Unfortunately she is having increasing symptoms, she tells me the heart racing symptoms she is having now were not felt when she wore her Zio patch last. She is under some stress at home, however considering the new symptoms we will have her do a new Zio patch. She will continue her Toprol-XL for now and we will get some labs.

## 2023-10-31 NOTE — Progress Notes (Signed)
    Procedures performed today:    None.  Independent interpretation of notes and tests performed by another provider:   None.  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 She did have a Zio patch monitor about 2 years ago that revealed some PVCs and PACs, a single 4 beat run of SVT at 150 beats a minute, occasional other scattered ectopy but predominantly normal sinus rhythm with sinus arrhythmias. She saw my partner and was placed on Toprol XL low-dose, and she did really well. Unfortunately she is having increasing symptoms, she tells me the heart racing symptoms she is having now were not felt when she wore her Zio patch last. She is under some stress at home, however considering the new symptoms we will have her do a new Zio patch. She will continue her Toprol-XL for now and we will get some labs.  GAD (generalized anxiety disorder) Increasing anxiety as well, occasional episodes of panic, she does have behavioral therapist, symptoms are heart racing, shortness of breath, sweating. She is currently only on Wellbutrin, she has been on some SSRIs in the past but came off due to weight gain. Zoloft seemed to work the best, considering increasing anxiety symptoms we will restart Zoloft at 25 mg daily, I will also add Ativan for her to take as rescue. She did try some CBD oil that seem to help as well, over the next month she will see which 1 seems to work the best. We can get her stabilized over about 6 months at which point we can try to wean her off of Zoloft.    ____________________________________________ Ihor Austin. Benjamin Stain, M.D., ABFM., CAQSM., AME. Primary Care and Sports Medicine [employer] [facility]  Adjunct Professor of Family Medicine  [place] [employer]  Restaurant manager, fast food

## 2023-10-31 NOTE — Assessment & Plan Note (Signed)
Increasing anxiety as well, occasional episodes of panic, she does have behavioral therapist, symptoms are heart racing, shortness of breath, sweating. She is currently only on Wellbutrin, she has been on some SSRIs in the past but came off due to weight gain. Zoloft seemed to work the best, considering increasing anxiety symptoms we will restart Zoloft at 25 mg daily, I will also add Ativan for her to take as rescue. She did try some CBD oil that seem to help as well, over the next month she will see which 1 seems to work the best. We can get her stabilized over about 6 months at which point we can try to wean her off of Zoloft.

## 2023-11-01 ENCOUNTER — Encounter: Payer: Self-pay | Admitting: Emergency Medicine

## 2023-11-01 ENCOUNTER — Other Ambulatory Visit: Payer: Self-pay | Admitting: Sports Medicine

## 2023-11-01 DIAGNOSIS — F411 Generalized anxiety disorder: Secondary | ICD-10-CM

## 2023-11-01 LAB — COMPREHENSIVE METABOLIC PANEL
ALT: 12 [IU]/L (ref 0–32)
AST: 19 [IU]/L (ref 0–40)
Albumin: 4.3 g/dL (ref 3.9–4.9)
Alkaline Phosphatase: 67 [IU]/L (ref 44–121)
BUN/Creatinine Ratio: 10 (ref 9–23)
BUN: 7 mg/dL (ref 6–20)
Bilirubin Total: 0.3 mg/dL (ref 0.0–1.2)
CO2: 23 mmol/L (ref 20–29)
Calcium: 9.3 mg/dL (ref 8.7–10.2)
Chloride: 102 mmol/L (ref 96–106)
Creatinine, Ser: 0.67 mg/dL (ref 0.57–1.00)
Globulin, Total: 2.4 g/dL (ref 1.5–4.5)
Glucose: 85 mg/dL (ref 70–99)
Potassium: 3.9 mmol/L (ref 3.5–5.2)
Sodium: 139 mmol/L (ref 134–144)
Total Protein: 6.7 g/dL (ref 6.0–8.5)
eGFR: 118 mL/min/{1.73_m2} (ref >59)

## 2023-11-01 LAB — LIPID PANEL
Chol/HDL Ratio: 2.6 {ratio} (ref 0.0–4.4)
Cholesterol, Total: 147 mg/dL (ref 100–199)
HDL: 57 mg/dL (ref >39)
LDL Chol Calc (NIH): 80 mg/dL (ref 0–99)
Triglycerides: 43 mg/dL (ref 0–149)
VLDL Cholesterol Cal: 10 mg/dL (ref 5–40)

## 2023-11-01 LAB — CBC
Hematocrit: 36.9 % (ref 34.0–46.6)
Hemoglobin: 12.7 g/dL (ref 11.1–15.9)
MCH: 30.1 pg (ref 26.6–33.0)
MCHC: 34.4 g/dL (ref 31.5–35.7)
MCV: 87 fL (ref 79–97)
Platelets: 291 10*3/uL (ref 150–450)
RBC: 4.22 x10E6/uL (ref 3.77–5.28)
RDW: 11.6 % — ABNORMAL LOW (ref 11.7–15.4)
WBC: 9.2 10*3/uL (ref 3.4–10.8)

## 2023-11-01 LAB — TSH: TSH: 2.32 u[IU]/mL (ref 0.450–4.500)

## 2023-11-01 LAB — SPECIMEN STATUS REPORT

## 2023-11-23 DIAGNOSIS — R002 Palpitations: Secondary | ICD-10-CM

## 2023-11-30 ENCOUNTER — Ambulatory Visit (INDEPENDENT_AMBULATORY_CARE_PROVIDER_SITE_OTHER): Payer: 59 | Admitting: Sports Medicine

## 2023-11-30 ENCOUNTER — Encounter: Payer: Self-pay | Admitting: Sports Medicine

## 2023-11-30 VITALS — BP 93/61 | HR 75 | Temp 97.8°F | Resp 20 | Ht 67.0 in

## 2023-11-30 DIAGNOSIS — L501 Idiopathic urticaria: Secondary | ICD-10-CM

## 2023-11-30 DIAGNOSIS — F411 Generalized anxiety disorder: Secondary | ICD-10-CM | POA: Diagnosis not present

## 2023-11-30 DIAGNOSIS — R002 Palpitations: Secondary | ICD-10-CM

## 2023-11-30 DIAGNOSIS — Z20818 Contact with and (suspected) exposure to other bacterial communicable diseases: Secondary | ICD-10-CM | POA: Diagnosis not present

## 2023-11-30 MED ORDER — FLUCONAZOLE 150 MG PO TABS: 150.0000 mg | ORAL_TABLET | Freq: Once | ORAL | 3 refills | Status: AC

## 2023-11-30 MED ORDER — FAMOTIDINE 20 MG PO TABS: 20.0000 mg | ORAL_TABLET | Freq: Two times a day (BID) | ORAL | 3 refills | Status: AC

## 2023-11-30 MED ORDER — AMOXICILLIN 500 MG PO TABS: 1000.0000 mg | ORAL_TABLET | Freq: Every day | ORAL | 0 refills | Status: AC

## 2023-11-30 NOTE — Assessment & Plan Note (Addendum)
 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 She did have a Zio patch monitor about 2 years ago that revealed some PVCs and PACs, a single 4 beat run of SVT at 150 beats a minute, occasional other scattered ectopy but predominantly normal sinus rhythm with sinus arrhythmias. She saw my partner and was placed on Toprol XL low-dose, and she did really well. Unfortunately she is having increasing symptoms, she tells me the heart racing symptoms she is having now were not felt when she wore her Zio patch last. She is under some stress at home, however considering the new symptoms we will have her do a new Zio patch. She will continue her Toprol-XL for now and we will get some labs.  Update 11/30/2023: Still has the Zio patch monitor on, Toprol-XL low-dose seems to help, we will follow this up when the Zio patch is finished.  Update 12/27/23:  Event monitoring shows that heart rhythm was normal sinus rhythm during triggered episodes. Occasional PACs as before, some of this is anxiety related due to normal rhythm during triggered events.  No change in plan.  Continue TOPROL XL and can consider discontinuing when anxiety under control for a few more months.

## 2023-11-30 NOTE — Assessment & Plan Note (Signed)
 Doing much better on Zoloft 25, she has not needed to use her Ativan. Continue this and return as needed.

## 2023-11-30 NOTE — Progress Notes (Addendum)
    Procedures performed today:    None.  Independent interpretation of notes and tests performed by another provider:   None.  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 She is now starting to have a sore throat.  No fevers, chills, headaches, muscle aches, body aches.  No coughing. She did test negative for strep today, I am get a prophylactic considering her exposure to her child. She does get yeast infections with antibiotics so we will also add Diflucan.   GAD (generalized anxiety disorder) Doing much better on Zoloft 25, she has not needed to use her Ativan. Continue this and return as needed.  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 She did have a Zio patch monitor about 2 years ago that revealed some PVCs and PACs, a single 4 beat run of SVT at 150 beats a minute, occasional other scattered ectopy but predominantly normal sinus rhythm with sinus arrhythmias. She saw my partner and was placed on Toprol XL low-dose, and she did really well. Unfortunately she is having increasing symptoms, she tells me the heart racing symptoms she is having now were not felt when she wore her Zio patch last. She is under some stress at home, however considering the new symptoms we will have her do a new Zio patch. She will continue her Toprol-XL for now and we will get some labs.  Update 11/30/2023: Still has the Zio patch monitor on, Toprol-XL low-dose seems to help, we will follow this up when the Zio patch is finished.  Update 12/27/23:  Event monitoring shows that heart rhythm was normal sinus rhythm during triggered episodes. Occasional PACs as  before, some of this is anxiety related due to normal rhythm during triggered events.  No change in plan.  Continue TOPROL XL and can consider discontinuing when anxiety under control for a few more months.    ____________________________________________ Ihor Austin. Benjamin Stain, M.D., ABFM., CAQSM., AME. Primary Care and Sports Medicine  [facility]  Adjunct Professor of Family Medicine  [place] of [employer]  Restaurant manager, fast food

## 2023-11-30 NOTE — Assessment & Plan Note (Signed)
 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 She is now starting to have a sore throat.  No fevers, chills, headaches, muscle aches, body aches.  No coughing. She did test negative for strep today, I am get a prophylactic considering her exposure to her child. She does get yeast infections with antibiotics so we will also add Diflucan.

## 2023-12-22 ENCOUNTER — Encounter: Payer: Self-pay | Admitting: Sports Medicine

## 2023-12-28 ENCOUNTER — Emergency Department (HOSPITAL_BASED_OUTPATIENT_CLINIC_OR_DEPARTMENT_OTHER)
Admission: EM | Admit: 2023-12-28 | Discharge: 2023-12-28 | Disposition: A | Discharge status: Home / Self Care | Attending: Emergency Medicine | Admitting: Emergency Medicine

## 2023-12-28 ENCOUNTER — Other Ambulatory Visit: Payer: Self-pay

## 2023-12-28 ENCOUNTER — Encounter (HOSPITAL_BASED_OUTPATIENT_CLINIC_OR_DEPARTMENT_OTHER): Payer: Self-pay | Admitting: Emergency Medicine

## 2023-12-28 DIAGNOSIS — L509 Urticaria, unspecified: Secondary | ICD-10-CM | POA: Diagnosis present

## 2023-12-28 HISTORY — DX: Migraine, unspecified, not intractable, without status migrainosus: G43.909

## 2023-12-28 NOTE — ED Notes (Signed)
 Pt given discharge instructions. Opportunities given for questions. Pt verbalizes understanding. Jillyn Hidden, RN

## 2023-12-28 NOTE — ED Provider Notes (Signed)
 [facility] AT [facility] Provider Note   CSN: [id number] Arrival date & time: 12/28/23  1238     History  Chief Complaint  Patient presents with   Urticaria    Jennifer Bell is a 33 y.o. female who presents with urticaria x 1 year.  Patient has been followed by her PCP and allergist.  She recently saw an allergist who recommended she take 4 Zyrtec a day.  Patient has been taking 2 Zyrtec and 2 Pepcid a day x 1 year.  She continues to have intermittent urticaria.  Reports no difficulty swallowing or difficulty breathing.  Reports she did start Wellbutrin approximately a year ago as well she is unsure whether this is contributing.   Urticaria      Past Medical History:  Diagnosis Date   Anxiety    Barrett esophagus    GERD (gastroesophageal reflux disease)    Heart palpitations    Hiatal hernia    Migraine      Home Medications Prior to Admission medications   Medication Sig Start Date End Date Taking? Authorizing Provider  buPROPion (WELLBUTRIN XL) 150 MG 24 hr tablet Take 1 tablet (150 mg total) by mouth daily. 11/01/23   Monica Becton, MD  cetirizine (ZYRTEC) 10 MG tablet Take 1 tablet (10 mg total) by mouth daily. 03/07/23   Charlton Amor, DO  diclofenac (VOLTAREN) 0.1 % ophthalmic solution Place 1 drop into the left eye 4 (four) times daily. 08/02/23   Eustace Moore, MD  famotidine (PEPCID) 20 MG tablet Take 1 tablet (20 mg total) by mouth 2 (two) times daily. 11/30/23   Monica Becton, MD  LORazepam (ATIVAN) 0.5 MG tablet Take 1 tablet (0.5 mg total) by mouth daily as needed for anxiety. 10/31/23   Monica Becton, MD  metoprolol succinate (TOPROL-XL) 25 MG 24 hr tablet Take 1 tablet (25 mg total) by mouth daily. 07/05/23   Monica Becton, MD  sertraline (ZOLOFT) 25 MG tablet Take 1 tablet (25 mg total) by mouth daily. 10/31/23   Monica Becton, MD      Allergies    Amoxicillin-pot  clavulanate    Review of Systems   Review of Systems  Skin:  Positive for rash.    Physical Exam Updated Vital Signs BP 115/77   Pulse 69   Temp 98.5 F (36.9 C) (Oral)   Resp 18   Ht 5\' 7"  (1.702 m)   Wt 67.1 kg   LMP 12/14/2023 (Approximate)   SpO2 100%   BMI 23.18 kg/m  Physical Exam Vitals and nursing note reviewed.  Constitutional:      General: She is not in acute distress.    Appearance: She is well-developed.  HENT:     Head: Normocephalic and atraumatic.  Eyes:     Conjunctiva/sclera: Conjunctivae normal.  Cardiovascular:     Rate and Rhythm: Normal rate and regular rhythm.     Heart sounds: No murmur heard. Pulmonary:     Effort: Pulmonary effort is normal. No respiratory distress.     Breath sounds: Normal breath sounds.  Abdominal:     Palpations: Abdomen is soft.     Tenderness: There is no abdominal tenderness.  Musculoskeletal:        General: No swelling.     Cervical back: Neck supple.  Skin:    General: Skin is warm and dry.     Capillary Refill: Capillary refill takes less than 2 seconds.  Comments: Urticaria involving abdomen and right torso.  Nontender to palpation  Neurological:     Mental Status: She is alert.  Psychiatric:        Mood and Affect: Mood normal.     ED Results / Procedures / Treatments   Labs (all labs ordered are listed, but only abnormal results are displayed) Labs Reviewed - No data to display  EKG None  Radiology No results found.  Procedures Procedures    Medications Ordered in ED Medications - No data to display  ED Course/ Medical Decision Making/ A&P                                 Medical Decision Making  This patient presents to the ED with chief complaint(s) of urticaria.  The complaint involves an extensive differential diagnosis and also carries with it a high risk of complications and morbidity.   pertinent past medical history as listed in HPI  The differential diagnosis includes   Anaphylaxis, drug reaction, dermatitis, chronic urticaria  Additional history obtained: Additional history obtained from family Records reviewed Care Everywhere/External Records  Initial Assessment:   Hemodynamically stable, afebrile, nontoxic-appearing patient presenting with intermittent urticaria x 1 year.  On exam patient has some urticaria involving her abdomen and torso.  She is chronically on Zyrtec and Pepcid.  Recently saw an allergist who recommended increasing her dose of Zyrtec.  She has no angioedema.  Her lungs are clear without stridor.  No reports of vomiting.  Low suspicion for anaphylaxis.  She was started on Wellbutrin approximately a year ago, however do not suspect acute drug reaction such as TENs, DRESS or SJS.  Offered patient additional medication including steroid taper.  Patient declines.  Offered to obtain routine blood work, however as it has been going on for a year now she has had blood work since did not feel that this would contribute any to her management.  Using shared decision making agreed to discharge.  Recommended following allergist recommendations of for Zyrtec a day to assess for any improvement also recommended discussing discontinuing Wellbutrin with the prescriber to see if any relief.  Patient is understanding agreeable to discharge.  Independent ECG interpretation:  none  Independent labs interpretation:  The following labs were independently interpreted:  none  Independent visualization and interpretation of imaging: none  Treatment and Reassessment: none  Consultations obtained:   none  Disposition:   The patient has been appropriately medically screened and/or stabilized in the ED. I have low suspicion for any other emergent medical condition which would require further screening, evaluation or treatment in the ED or require inpatient management. At time of discharge the patient is hemodynamically stable and in no acute distress. I have  discussed work-up results and diagnosis with patient and answered all questions. Patient is agreeable with discharge plan. We discussed strict return precautions for returning to the emergency department and they verbalized understanding.     Social Determinants of Health:   none  This note was dictated with voice recognition software.  Despite best efforts at proofreading, errors may have occurred which can change the documentation meaning.          Final Clinical Impression(s) / ED Diagnoses Final diagnoses:  Urticaria    Rx / DC Orders ED Discharge Orders     None         Halford Decamp, PA-C 12/28/23 1729  Glyn Ade, MD 12/28/23 [phone]

## 2023-12-28 NOTE — Discharge Instructions (Signed)
 You were evaluated in the emergency room for urticaria.  As discussed I would recommend following your allergist recommendations to see for any improvement.  I would also discuss discontinuing the Wellbutrin with your prescriber to evaluate for any relief.  Lastly I will contact your PCP for a second opinion with an allergist.

## 2023-12-28 NOTE — ED Triage Notes (Signed)
 Pt via pov from home with hives that come and go in the last year. Pt states she has seen pcp and allergist with no resolution or answers. Pt also reports palpitations and has been on a heart monitor for last 2 weeks. Pt alert & oriented, nad noted.

## 2023-12-29 ENCOUNTER — Ambulatory Visit: Admitting: Medical-Surgical

## 2023-12-29 VITALS — BP 109/72 | HR 81 | Resp 20 | Ht 67.0 in | Wt 145.8 lb

## 2023-12-29 DIAGNOSIS — L501 Idiopathic urticaria: Secondary | ICD-10-CM | POA: Diagnosis not present

## 2023-12-29 MED ORDER — TRIAMCINOLONE ACETONIDE 0.1 % EX CREA: 1.0000 | TOPICAL_CREAM | Freq: Two times a day (BID) | CUTANEOUS | 3 refills | Status: DC

## 2023-12-29 MED ORDER — MONTELUKAST SODIUM 10 MG PO TABS: 10.0000 mg | ORAL_TABLET | Freq: Every day | ORAL | 3 refills | Status: AC

## 2023-12-29 NOTE — Progress Notes (Signed)
        Established patient visit  History, exam, impression, and plan:  1. Chronic idiopathic urticaria (Primary) Very pleasant 33 year old female presenting today with complaints of hives that have recurred on her abdomen.  This episode has been going on for 3 days and she has been unable to sleep.  She has pictures as the hives progressed and notes that they originally started as 1 large circular hive on the abdomen then began to enlarge.  At 1 point, it was a perfect circle on her abdomen but it now has spread across the lower area along with a small patch on her right ribs.  She has been to dermatology once in the past but not for this particular problem.  She went to see an allergist and they have been working her up for any particular cause.  She is on allergy shots that she has been doing for 6 months.  She went most recently for evaluation and her allergist told her to take 4 Zyrtec daily.  She is already on 2 Zyrtec daily and experiencing significant dry mouth, dry eye, and dry skin.  She did not increase her dosing of Zyrtec to 4 tablets daily due to already experiencing unpleasant side effects.  Today she reports that her stomach is very itchy, swollen, and is now burning.  She has developed a bruise in the middle of the area and is not sure where this may have come from.  Feels that we are her last hope right now to get some relief and be able to sleep.  On evaluation she does have an irregular shaped erythemic, slightly raised rash scattered across her lower abdomen and right ribs.  A light yellow bruise is noted on the left lower quadrant near the umbilicus.  She does note using an old prescription of triamcinolone last night and a desperate measure for relief.  Feels that it helped with the itching and the redness of her rash is not quite so bad this morning.  Plan for triamcinolone twice daily to the area for up to 14 days.  Since she is not going to tolerate doubling her Zyrtec dose,  discussed possibly adding Singulair.  She has never tried this before but is open to the option.  Sending Singulair 10 mg nightly.  Would like to get her back in with dermatology for further evaluation.  Referral placed. - triamcinolone cream (KENALOG) 0.1 %; Apply 1 Application topically 2 (two) times daily.  Dispense: 45 g; Refill: 3 - montelukast (SINGULAIR) 10 MG tablet; Take 1 tablet (10 mg total) by mouth at bedtime.  Dispense: 90 tablet; Refill: 3 - Ambulatory referral to Dermatology   Procedures performed this visit: None.  Return for Follow-up with PCP as directed.  __________________________________ Thayer Ohm, DNP, APRN, FNP-BC Primary Care and Sports Medicine [facility] [place]

## 2024-02-13 ENCOUNTER — Other Ambulatory Visit: Payer: Self-pay | Admitting: Sports Medicine

## 2024-02-13 DIAGNOSIS — F411 Generalized anxiety disorder: Secondary | ICD-10-CM

## 2024-03-13 ENCOUNTER — Ambulatory Visit

## 2024-03-13 ENCOUNTER — Ambulatory Visit (INDEPENDENT_AMBULATORY_CARE_PROVIDER_SITE_OTHER): Admitting: Sports Medicine

## 2024-03-13 VITALS — BP 107/70 | HR 71 | Temp 98.0°F | Wt 155.0 lb

## 2024-03-13 DIAGNOSIS — R053 Chronic cough: Secondary | ICD-10-CM

## 2024-03-13 DIAGNOSIS — R059 Cough, unspecified: Secondary | ICD-10-CM

## 2024-03-13 MED ORDER — PREDNISONE 50 MG PO TABS: 50.0000 mg | ORAL_TABLET | Freq: Every day | ORAL | 0 refills | Status: DC

## 2024-03-13 MED ORDER — AZITHROMYCIN 250 MG PO TABS: ORAL_TABLET | ORAL | 0 refills | Status: DC

## 2024-03-13 MED ORDER — HYDROCOD POLI-CHLORPHE POLI ER 10-8 MG/5ML PO SUER
5.0000 mL | Freq: Two times a day (BID) | ORAL | 0 refills | Status: DC | PRN
Start: 2024-03-13 — End: 2024-04-22

## 2024-03-13 NOTE — Progress Notes (Signed)
    Procedures performed today:    None.  Independent interpretation of notes and tests performed by another provider:   None.  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 On exam she has no signs of respiratory distress, she does have significantly coarse breath sounds, bronchitic sounds throughout her lung fields, she is coughing in the exam room, head and neck exam is normal. I have suggested avoidance of any eating within 2 hours of bedtime, she does not have much in the line of postnasal drip. Considering duration of the cough and her abnormal breath sounds we will treat aggressively, Tussionex, azithromycin , prednisone , chest x-ray. Return to see me in 2 to 3 weeks.    ____________________________________________ Jennifer Bell. Sandy Crumb, M.D., ABFM., CAQSM., AME. Primary Care and Sports Medicine [facility]  Adjunct Professor of [facility] Medicine  [employer]  Restaurant manager, fast food

## 2024-03-13 NOTE — Assessment & Plan Note (Signed)
 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 On exam she has no signs of respiratory distress, she does have significantly coarse breath sounds, bronchitic sounds throughout her lung fields, she is coughing in the exam room, head and neck exam is normal. I have suggested avoidance of any eating within 2 hours of bedtime, she does not have much in the line of postnasal drip. Considering duration of the cough and her abnormal breath sounds we will treat aggressively, Tussionex, azithromycin , prednisone , chest x-ray. Return to see me in 2 to 3 weeks.

## 2024-03-15 ENCOUNTER — Ambulatory Visit: Payer: Self-pay | Admitting: Sports Medicine

## 2024-04-22 ENCOUNTER — Encounter: Payer: Self-pay | Admitting: Obstetrics & Gynecology

## 2024-04-22 ENCOUNTER — Ambulatory Visit: Admitting: Obstetrics & Gynecology

## 2024-04-22 VITALS — BP 112/74 | HR 75 | Ht 67.0 in | Wt 152.0 lb

## 2024-04-22 DIAGNOSIS — K2270 Barrett's esophagus without dysplasia: Secondary | ICD-10-CM | POA: Diagnosis not present

## 2024-04-22 DIAGNOSIS — Z1331 Encounter for screening for depression: Secondary | ICD-10-CM

## 2024-04-22 DIAGNOSIS — F411 Generalized anxiety disorder: Secondary | ICD-10-CM | POA: Diagnosis not present

## 2024-04-22 DIAGNOSIS — Z8742 Personal history of other diseases of the female genital tract: Secondary | ICD-10-CM | POA: Insufficient documentation

## 2024-04-22 NOTE — Progress Notes (Signed)
.  Last pap smear (date and result):2023 Last mammogram (date and result):04/10/23-- BI-RADS CATEGORY 1: Negative.  Last colon screening (date and result):NA Brush:Yes Floss:Yes Seatbelts: Yes Sunscreen: Yes  Subjective:     Jennifer Bell is a 33 y.o. female here for a routine exam.  Current complaints: anxiety, hives.     Gynecologic History Patient's last menstrual period was 04/10/2024 (exact date). Contraception: vasectomy .Last pap smear (date and result):2023 Last mammogram (date and result):04/10/23-- BI-RADS CATEGORY 1: Negative.  Last colon screening (date and result):NA Brush:Yes Floss:Yes Seatbelts: Yes Sunscreen: Yes  Obstetric History OB History  Gravida Para Term Preterm AB Living  2 2 2   1   SAB IAB Ectopic Multiple Live Births     0 2    # Outcome Date GA Lbr Len/2nd Weight Sex Type Anes PTL Lv  2 Term 12/20/15 [redacted]w[redacted]d 04:53 / 02:33 8 lb 12.6 oz (3.986 kg) M Vag-Spont EPI  LIV     Birth Comments: wnl  1 Term              The following portions of the patient's history were reviewed and updated as appropriate: allergies, current medications, past family history, past medical history, past social history, past surgical history, and problem list.  Review of Systems Pertinent items noted in HPI and remainder of comprehensive ROS otherwise negative.    Objective:     Vitals:   04/22/24 0938  BP: 112/74  Pulse: 75  Weight: 152 lb (68.9 kg)  Height: 5' 7 (1.702 m)   Vitals:  WNL General appearance: alert, cooperative and no distress  HEENT: Normocephalic, without obvious abnormality, atraumatic Eyes: negative Throat: lips, mucosa, and tongue normal; teeth and gums normal  Respiratory: Clear to auscultation bilaterally  CV: Regular rate and rhythm  Breasts:  Normal appearance, no masses or tenderness, no nipple retraction or dimpling  GI: Soft, non-tender; bowel sounds normal; no masses,  no organomegaly  GU: External Genitalia:  Tanner V, well  healed scars from folliculitis Urethra:  No prolapse   Vagina: Pink, normal rugae, no blood or discharge  Cervix: No CMT, no lesion  Uterus:  Normal size and contour, non tender  Adnexa: Normal, no masses, non tender  Musculoskeletal: No edema, redness or tenderness in the calves or thighs  Skin: No lesions or rash  Lymphatic: Axillary adenopathy: none     Psychiatric: Normal mood and behavior        Assessment:    Healthy female exam.    Plan:    Pap up to date Barrett's esophagus--has not had follow up in years  Reviewed records and did have biopsy proven Barretts in 2015 and was supposed to have EGD 2 years later Mammogram at 39 unless symptomatic.  Anxiety--reviewed meds and symptoms.  Sees therapist weekly.  Would like to see different provider than she saw last time.  Gene testing could be beneficial.  Vasectomy for birth control

## 2024-05-08 ENCOUNTER — Ambulatory Visit (HOSPITAL_COMMUNITY): Payer: Self-pay | Admitting: Registered Nurse

## 2024-05-08 ENCOUNTER — Telehealth (HOSPITAL_COMMUNITY): Payer: Self-pay

## 2024-05-08 ENCOUNTER — Encounter (HOSPITAL_COMMUNITY): Payer: Self-pay | Admitting: Registered Nurse

## 2024-05-08 DIAGNOSIS — F331 Major depressive disorder, recurrent, moderate: Secondary | ICD-10-CM | POA: Diagnosis not present

## 2024-05-08 DIAGNOSIS — F909 Attention-deficit hyperactivity disorder, unspecified type: Secondary | ICD-10-CM

## 2024-05-08 DIAGNOSIS — F411 Generalized anxiety disorder: Secondary | ICD-10-CM | POA: Diagnosis not present

## 2024-05-08 MED ORDER — ATOMOXETINE HCL 10 MG PO CAPS: 10.0000 mg | ORAL_CAPSULE | Freq: Every day | ORAL | 0 refills | Status: AC

## 2024-05-08 MED ORDER — DULOXETINE HCL 30 MG PO CPEP: 30.0000 mg | ORAL_CAPSULE | Freq: Every day | ORAL | 0 refills | Status: DC
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
## 2024-05-08 NOTE — Patient Instructions (Signed)
 If no one has contacted, you by the end of business day today please call the appropriate office listed below to schedule your next visit for medication management with Luisa Ruder, NP:    [facility] at [facility] [address], [place], [place] [address]  [address] Phone: [phone] (Call to schedule appointment)  [facility] at [facility] [address], [place], [place] [address]  [address] Phone: [phone] (Call to schedule appointment)   ADHD Online Testing         Address:  [phone]?[place], [place] [address] Phone: [phone] Fax: [phone] Email:  [email or web address]  [facility]HD [facility] in [place], [place] At [facility], we are dedicated to providing comprehensive ADHD treatment and management for patients of all ages. Our range of services is designed to address the unique needs of each individual, ensuring personalized and effective care.  ADHD Evaluation and Diagnosis Accurate diagnosis is the first step towards effective treatment. Our thorough evaluation process includes:   QbTest to objectively measure ADHD symptoms  Standardized ADHD rating scales  Behavioral assessments  Medical history review  Individualized Treatment Plans We believe in personalized care. Based on your evaluation, we develop a tailored treatment plan that may include:   Medication management  Lifestyle and dietary recommendations  Parent and family education  Social skills training  Organizational and time management strategies

## 2024-05-08 NOTE — Telephone Encounter (Signed)
 Medication management - Prior authorization for patient's prescribed Atomoxetine  10 mg, one a day, #30 completed online with CoverMyMeds and sent to patient's [employer] for pending review and decision.

## 2024-05-09 NOTE — Telephone Encounter (Signed)
 Medication management - Fax received from [facility] [employer] for approval of patient's prescribed Atomoxetine  10 mg capsules, until 05/09/2027. Called pt's [facility] to inform this medication had been approved and could now be filled.

## 2024-06-04 ENCOUNTER — Encounter: Payer: Self-pay | Admitting: Sports Medicine

## 2024-06-11 ENCOUNTER — Encounter: Payer: Self-pay | Admitting: Gastroenterology

## 2024-07-18 ENCOUNTER — Other Ambulatory Visit: Payer: Self-pay

## 2024-07-18 ENCOUNTER — Ambulatory Visit
Admission: RE | Admit: 2024-07-18 | Discharge: 2024-07-18 | Disposition: A | Discharge status: Home / Self Care | Source: Ambulatory Visit | Attending: Family Medicine | Admitting: Family Medicine

## 2024-07-18 ENCOUNTER — Ambulatory Visit: Payer: Self-pay

## 2024-07-18 VITALS — BP 112/77 | HR 78 | Temp 98.1°F | Resp 16

## 2024-07-18 DIAGNOSIS — H6691 Otitis media, unspecified, right ear: Secondary | ICD-10-CM | POA: Diagnosis not present

## 2024-07-18 DIAGNOSIS — H9201 Otalgia, right ear: Secondary | ICD-10-CM

## 2024-07-18 HISTORY — DX: Generalized anxiety disorder: F41.1

## 2024-07-18 HISTORY — DX: Idiopathic urticaria: L50.1

## 2024-07-18 HISTORY — DX: Depression, unspecified: F32.A

## 2024-07-18 MED ORDER — CEFDINIR 300 MG PO CAPS: 300.0000 mg | ORAL_CAPSULE | Freq: Two times a day (BID) | ORAL | 0 refills | Status: DC

## 2024-07-18 MED ORDER — IBUPROFEN 800 MG PO TABS
800.0000 mg | ORAL_TABLET | Freq: Three times a day (TID) | ORAL | 0 refills | Status: DC
Start: 2024-07-18 — End: 2024-08-13

## 2024-07-18 MED ORDER — AZELASTINE-FLUTICASONE 137-50 MCG/ACT NA SUSP: 1.0000 | Freq: Two times a day (BID) | NASAL | 0 refills | Status: AC

## 2024-07-18 NOTE — Discharge Instructions (Addendum)
 Take the cefdinir  2 times a day.  This is an antibiotic that is good for ear infections Use the Dymista  nasal spray morning and night until your symptoms have resolved May take Tylenol /ibuprofen  as needed for pain Make sure you are drinking lots of water See [place] tomorrow.  I think you will enjoy that visit

## 2024-07-18 NOTE — ED Provider Notes (Signed)
 Jennifer Bell CARE    CSN: [id number] Arrival date & time: 07/18/24  1617      History   Chief Complaint Chief Complaint  Patient presents with   Ear Fullness    HPI Jennifer Bell is a 33 y.o. female.   Patient has severe ear pain since last night.  She states she cannot get into see her doctor until tomorrow and did not want to go another night without treatment.  She just spent a week on vacation and did a lot of swimming.  She states she gets ear infections about once a year. She also complains of chronic pain in her left SI region when she has any twisting movement.  It comes and goes She also complains of chronic idiopathic urticaria that no one has figured out how to control     Past Medical History:  Diagnosis Date   Anxiety    Barrett esophagus    Depression    GAD (generalized anxiety disorder)    GERD (gastroesophageal reflux disease)    Heart palpitations    Hiatal hernia    Hives    Idiopathic urticaria    Migraine     Patient Active Problem List   Diagnosis Date Noted   History of abnormal cervical Papanicolaou smear 04/22/2024   Persistent cough 03/13/2024   Conjunctivitis, left eye 05/31/2023   Chronic idiopathic urticaria with elevated IgE 03/07/2023   Breast pain 03/07/2023   Dysuria 12/21/2022   Abnormal weight gain 01/17/2022   Palpitations 09/28/2021   GAD (generalized anxiety disorder) 08/15/2016   Generalized abdominal pain 05/28/2014   Hiatal hernia 05/28/2014    Past Surgical History:  Procedure Laterality Date   MYRINGOTOMY WITH TUBE PLACEMENT     TONSILLECTOMY     WISDOM TOOTH EXTRACTION      OB History     Gravida  2   Para  2   Term  2   Preterm      AB      Living  1      SAB      IAB      Ectopic      Multiple  0   Live Births  2            Home Medications    Prior to Admission medications   Medication Sig Start Date End Date Taking? Authorizing Provider  Azelastine -Fluticasone   137-50 MCG/ACT SUSP Place 1 puff into the nose in the morning and at bedtime. 07/18/24  Yes Jennifer Jamee Jacob, MD  cefdinir  (OMNICEF ) 300 MG capsule Take 1 capsule (300 mg total) by mouth 2 (two) times daily. 07/18/24  Yes Jennifer Jamee Jacob, MD  ibuprofen  (ADVIL ) 800 MG tablet Take 1 tablet (800 mg total) by mouth 3 (three) times daily. 07/18/24  Yes Jennifer Jamee Jacob, MD  atomoxetine  (STRATTERA ) 10 MG capsule Take 1 capsule (10 mg total) by mouth daily. 05/08/24   Rankin, Shuvon B, NP  cetirizine  (ZYRTEC ) 10 MG tablet Take 1 tablet (10 mg total) by mouth daily. 03/07/23   Jennifer Bernice RAMAN, DO  famotidine  (PEPCID ) 20 MG tablet Take 1 tablet (20 mg total) by mouth 2 (two) times daily. 11/30/23   Jennifer Debby PARAS, MD  montelukast  (SINGULAIR ) 10 MG tablet Take 1 tablet (10 mg total) by mouth at bedtime. 12/29/23   Jennifer Mini, NP    Family History Family History  Problem Relation Age of Onset   Vitamin D  deficiency Mother  Diabetes Maternal Grandmother    Diabetes Maternal Grandfather    Lung cancer Maternal Grandfather    Colon cancer Maternal Grandfather    Breast cancer Paternal Grandmother    Non-Hodgkin's lymphoma Paternal Grandfather    Depression Paternal Aunt     Social History Social History   Tobacco Use   Smoking status: Never   Smokeless tobacco: Never  Vaping Use   Vaping status: Never Used  Substance Use Topics   Alcohol use: Yes    Comment: sometimes   Drug use: No     Allergies   Amoxicillin -pot clavulanate   Review of Systems Review of Systems  See HPI Physical Exam Triage Vital Signs ED Triage Vitals  Encounter Vitals Group     BP 07/18/24 1622 112/77     Girls Systolic BP Percentile --      Girls Diastolic BP Percentile --      Boys Systolic BP Percentile --      Boys Diastolic BP Percentile --      Pulse Rate 07/18/24 1622 78     Resp 07/18/24 1622 16     Temp 07/18/24 1622 98.1 F (36.7 C)     Temp src --      SpO2 07/18/24 1622 100 %      Weight --      Height --      Head Circumference --      Peak Flow --      Pain Score 07/18/24 1625 4     Pain Loc --      Pain Education --      Exclude from Growth Chart --    No data found.  Updated Vital Signs BP 112/77   Pulse 78   Temp 98.1 F (36.7 C)   Resp 16   LMP 06/17/2024 (Approximate)   SpO2 100%       Physical Exam Constitutional:      General: She is not in acute distress.    Appearance: She is well-developed and normal weight.  HENT:     Head: Normocephalic and atraumatic.     Right Ear: External ear normal.     Left Ear: Tympanic membrane and external ear normal.     Ears:     Comments: Right TM is bulging and red    Nose: Nose normal.     Mouth/Throat:     Mouth: Mucous membranes are moist.     Pharynx: No posterior oropharyngeal erythema.  Eyes:     Conjunctiva/sclera: Conjunctivae normal.     Pupils: Pupils are equal, round, and reactive to light.  Cardiovascular:     Rate and Rhythm: Normal rate.  Pulmonary:     Effort: Pulmonary effort is normal. No respiratory distress.  Abdominal:     General: There is no distension.     Palpations: Abdomen is soft.  Musculoskeletal:        General: Tenderness present. Normal range of motion.     Cervical back: Normal range of motion.     Comments: Mild tenderness left lumbar column of muscles and left SI joint  Skin:    General: Skin is warm and dry.  Neurological:     Mental Status: She is alert.      UC Treatments / Results  Labs (all labs ordered are listed, but only abnormal results are displayed) Labs Reviewed - No data to display  EKG   Radiology No results found.  Procedures Procedures (including critical care time)  Medications Ordered in UC Medications - No data to display  Initial Impression / Assessment and Plan / UC Course  I have reviewed the triage vital signs and the nursing notes.  Pertinent labs & imaging results that were available during my care of the patient  were reviewed by me and considered in my medical decision making (see chart for details).     Patient does have a follow-up with her PCP tomorrow.  At that time they can address her chronic complaints.  Today I will address her ear infection. Final Clinical Impressions(s) / UC Diagnoses   Final diagnoses:  OM (otitis media), recurrent, right  Otalgia, right ear     Discharge Instructions      Take the cefdinir  2 times a day.  This is an antibiotic that is good for ear infections Use the Dymista  nasal spray morning and night until your symptoms have resolved May take Tylenol /ibuprofen  as needed for pain Make sure you are drinking lots of water See Benton Quay tomorrow.  I think you will enjoy that visit   ED Prescriptions     Medication Sig Dispense Auth. Provider   Azelastine -Fluticasone  137-50 MCG/ACT SUSP Place 1 puff into the nose in the morning and at bedtime. 23 g Jennifer Jamee Jacob, MD   cefdinir  (OMNICEF ) 300 MG capsule Take 1 capsule (300 mg total) by mouth 2 (two) times daily. 20 capsule Jennifer Jamee Jacob, MD   ibuprofen  (ADVIL ) 800 MG tablet Take 1 tablet (800 mg total) by mouth 3 (three) times daily. 21 tablet Jennifer Jamee Jacob, MD      PDMP not reviewed this encounter.   Jennifer Jamee Jacob, MD 07/18/24 [phone]

## 2024-07-18 NOTE — Telephone Encounter (Signed)
 Patient scheduled for 07/19/2024 with Whitney Crain

## 2024-07-18 NOTE — Telephone Encounter (Signed)
 FYI Only or Action Required?: FYI only for provider.  Patient was last seen in primary care on 03/13/2024 by Curtis Debby PARAS, MD.  Called Nurse Triage reporting Flank Pain.  Symptoms began several days ago.  Interventions attempted: OTC medications: Ibuprofen  and Tylenol .  Symptoms are: gradually worsening.  Triage Disposition: See Physician Within 24 Hours  Patient/caregiver understands and will follow disposition?: Yes         Message from Cleave MATSU sent at 07/18/2024  1:37 PM EDT  Reason for Triage: pt is sick and wants an appt for tomorrow please call pt back to  assist   Reason for Disposition  MODERATE pain (e.g., interferes with normal activities or awakens from sleep)  Answer Assessment - Initial Assessment Questions 1. LOCATION: Where does it hurt? (e.g., left, right)     L side  2. ONSET: When did the pain start?     Monday  3. SEVERITY: How bad is the pain? (e.g., Scale 1-10; mild, moderate, or severe)     5/10 4. PATTERN: Does the pain come and go, or is it constant?      Comes and goes  5. CAUSE: What do you think is causing the pain?     Unsure  6. OTHER SYMPTOMS:  Do you have any other symptoms? (e.g., fever, abdomen pain, vomiting, leg weakness, burning with urination, blood in urine)     Patient states she has also had some ear discomfort as well and may have gotten water in her ear.   Movement worsens pain. Taking ibuprofen  and tylenol  for symptoms.  Protocols used: Flank Pain-A-AH

## 2024-07-18 NOTE — ED Triage Notes (Addendum)
 Right ear pain since last night. Also has left flank pain on and off for the past year. No pain sitting still, only when turning. Reports increased pain since Monday. Reports lymph nodes in neck swollen, hives for the past year. No fever. Has had ibuprofen , tylenol .

## 2024-07-19 ENCOUNTER — Ambulatory Visit (INDEPENDENT_AMBULATORY_CARE_PROVIDER_SITE_OTHER): Admitting: Urgent Care

## 2024-07-19 ENCOUNTER — Encounter: Payer: Self-pay | Admitting: Urgent Care

## 2024-07-19 VITALS — BP 126/75 | HR 92 | Ht 67.0 in | Wt 152.0 lb

## 2024-07-19 DIAGNOSIS — R591 Generalized enlarged lymph nodes: Secondary | ICD-10-CM | POA: Diagnosis not present

## 2024-07-19 DIAGNOSIS — M2559 Pain in other specified joint: Secondary | ICD-10-CM

## 2024-07-19 DIAGNOSIS — L508 Other urticaria: Secondary | ICD-10-CM

## 2024-07-19 DIAGNOSIS — R10A2 Flank pain, left side: Secondary | ICD-10-CM | POA: Diagnosis not present

## 2024-07-19 DIAGNOSIS — R5383 Other fatigue: Secondary | ICD-10-CM

## 2024-07-19 LAB — POCT URINALYSIS DIP (CLINITEK)
Bilirubin, UA: NEGATIVE
Glucose, UA: NEGATIVE mg/dL
Ketones, POC UA: NEGATIVE mg/dL
Leukocytes, UA: NEGATIVE
Nitrite, UA: NEGATIVE
POC PROTEIN,UA: NEGATIVE
Spec Grav, UA: <=1.005 — AB (ref 1.010–1.025)
Urobilinogen, UA: 0.2 U/dL
pH, UA: 5.5 (ref 5.0–8.0)

## 2024-07-19 NOTE — Progress Notes (Unsigned)
   Established Patient Office Visit  Subjective:  Patient ID: Jennifer Bell, female    DOB: [birth year]  Age: 33 y.o. MRN: [id number]  Chief Complaint  Patient presents with   Flank Pain    left    HPI  {History (Optional):23778}  ROS: as noted in HPI  Objective:     BP 126/75   Pulse 92   Ht 5' 7 (1.702 m)   Wt 152 lb (68.9 kg)   LMP 06/17/2024 (Approximate)   SpO2 97%   BMI 23.81 kg/m  {Vitals History (Optional):23777}  Physical Exam   Results for orders placed or performed in visit on 07/19/24  POCT URINALYSIS DIP (CLINITEK)  Result Value Ref Range   Color, UA yellow yellow   Clarity, UA clear clear   Glucose, UA negative negative mg/dL   Bilirubin, UA negative negative   Ketones, POC UA negative negative mg/dL   Spec Grav, UA <=8.994 (A) 1.010 - 1.025   Blood, UA trace-lysed (A) negative   pH, UA 5.5 5.0 - 8.0   POC PROTEIN,UA negative negative, trace   Urobilinogen, UA 0.2 0.2 or 1.0 E.U./dL   Nitrite, UA Negative Negative   Leukocytes, UA Negative Negative    {Labs (Optional):23779}  The ASCVD Risk score (Arnett DK, et al., 2019) failed to calculate for the following reasons:   The 2019 ASCVD risk score is only valid for ages 87 to 53  Assessment & Plan:  Left flank pain -     POCT URINALYSIS DIP (CLINITEK) -     Lupus Diagnostic Profile  Lymphadenopathy -     CMP14+EGFR -     CBC with Differential/Platelet -     Lyme Disease Serology w/Reflex  Chronic urticaria -     Tryptase -     Complement, total -     ANA,IFA RA Diag Pnl w/rflx Tit/Patn -     IgE -     Alpha-Gal Panel -     Sedimentation rate -     Celiac Disease Panel  Pain in other joint -     Lupus Diagnostic Profile -     Lyme Disease Serology w/Reflex -     Celiac Disease Panel  Other fatigue -     Lupus Diagnostic Profile -     Lyme Disease Serology w/Reflex     No follow-ups on file.   Benton LITTIE Gave, PA

## 2024-07-21 ENCOUNTER — Ambulatory Visit: Payer: Self-pay | Admitting: Urgent Care

## 2024-08-01 LAB — SEDIMENTATION RATE: Sed Rate: 6 mm/h (ref 0–32)

## 2024-08-01 LAB — CBC WITH DIFFERENTIAL/PLATELET
Basophils Absolute: 0 x10E3/uL (ref 0.0–0.2)
Basos: 0 %
EOS (ABSOLUTE): 0.4 x10E3/uL (ref 0.0–0.4)
Eos: 7 %
Hematocrit: 40.1 % (ref 34.0–46.6)
Hemoglobin: 13.5 g/dL (ref 11.1–15.9)
Immature Grans (Abs): 0 x10E3/uL (ref 0.0–0.1)
Immature Granulocytes: 0 %
Lymphocytes Absolute: 1.7 x10E3/uL (ref 0.7–3.1)
Lymphs: 25 %
MCH: 29.8 pg (ref 26.6–33.0)
MCHC: 33.7 g/dL (ref 31.5–35.7)
MCV: 89 fL (ref 79–97)
Monocytes Absolute: 0.5 x10E3/uL (ref 0.1–0.9)
Monocytes: 7 %
Neutrophils Absolute: 4.1 x10E3/uL (ref 1.4–7.0)
Neutrophils: 61 %
Platelets: 304 x10E3/uL (ref 150–450)
RBC: 4.53 x10E6/uL (ref 3.77–5.28)
RDW: 12 % (ref 11.7–15.4)
WBC: 6.7 x10E3/uL (ref 3.4–10.8)

## 2024-08-01 LAB — CMP14+EGFR
ALT: 8 IU/L (ref 0–32)
AST: 14 IU/L (ref 0–40)
Albumin: 4.7 g/dL (ref 3.9–4.9)
Alkaline Phosphatase: 68 IU/L (ref 41–116)
BUN/Creatinine Ratio: 9 (ref 9–23)
BUN: 7 mg/dL (ref 6–20)
Bilirubin Total: 0.4 mg/dL (ref 0.0–1.2)
CO2: 21 mmol/L (ref 20–29)
Calcium: 9.3 mg/dL (ref 8.7–10.2)
Chloride: 105 mmol/L (ref 96–106)
Creatinine, Ser: 0.75 mg/dL (ref 0.57–1.00)
Globulin, Total: 2.6 g/dL (ref 1.5–4.5)
Glucose: 89 mg/dL (ref 70–99)
Potassium: 3.9 mmol/L (ref 3.5–5.2)
Sodium: 141 mmol/L (ref 134–144)
Total Protein: 7.3 g/dL (ref 6.0–8.5)
eGFR: 107 mL/min/1.73 (ref >59)

## 2024-08-01 LAB — LUPUS DIAGNOSTIC PROFILE
Anti-Chromatin Ab, IgG (RDL): <20 U (ref <20)
Anti-La (SS-B) Ab (RDL): <20 U (ref <20)
Anti-Nuclear Ab by IFA (RDL): NEGATIVE
Anti-Ro (SS-A) Ab (RDL): <20 U (ref <20)
Anti-Sm Ab (RDL): <20 U (ref <20)
Anti-U1 RNP Ab (RDL): <20 U (ref <20)
Anti-dsDNA Ab by Farr(RDL): <8 [IU]/mL (ref <8.0)
C3 Complement (RDL): 155 mg/dL (ref 90–180)
C4 Complement (RDL): 39 mg/dL (ref 10–40)

## 2024-08-01 LAB — LYME DISEASE SEROLOGY W/REFLEX: Lyme Total Antibody EIA: NEGATIVE

## 2024-08-01 LAB — TRYPTASE: Tryptase: 5 ug/L (ref 2.2–13.2)

## 2024-08-01 LAB — COMPLEMENT, TOTAL: Compl, Total (CH50): 57 U/mL (ref >41)

## 2024-08-01 LAB — ALPHA-GAL PANEL
Allergen Lamb IgE: <0.1 kU/L
Beef IgE: <0.1 kU/L
IgE (Immunoglobulin E), Serum: 165 [IU]/mL (ref 6–495)
O215-IgE Alpha-Gal: <0.1 kU/L
Pork IgE: <0.1 kU/L

## 2024-08-01 LAB — ANA,IFA RA DIAG PNL W/RFLX TIT/PATN
ANA Titer 1: NEGATIVE
Cyclic Citrullin Peptide Ab: 10 U (ref 0–19)
Rheumatoid fact SerPl-aCnc: 10.2 [IU]/mL (ref <14.0)

## 2024-08-01 LAB — CELIAC DISEASE PANEL
Endomysial IgA: NEGATIVE
IgA/Immunoglobulin A, Serum: 286 mg/dL (ref 87–352)
Transglutaminase IgA: <2 U/mL (ref 0–3)

## 2024-08-13 ENCOUNTER — Ambulatory Visit: Admitting: Gastroenterology

## 2024-08-13 ENCOUNTER — Encounter: Payer: Self-pay | Admitting: Gastroenterology

## 2024-08-13 VITALS — BP 106/72 | HR 80 | Ht 67.0 in | Wt 148.0 lb

## 2024-08-13 DIAGNOSIS — Z800 Family history of malignant neoplasm of digestive organs: Secondary | ICD-10-CM | POA: Diagnosis not present

## 2024-08-13 DIAGNOSIS — K449 Diaphragmatic hernia without obstruction or gangrene: Secondary | ICD-10-CM | POA: Diagnosis not present

## 2024-08-13 DIAGNOSIS — Z83719 Family history of colon polyps, unspecified: Secondary | ICD-10-CM | POA: Diagnosis not present

## 2024-08-13 DIAGNOSIS — K219 Gastro-esophageal reflux disease without esophagitis: Secondary | ICD-10-CM

## 2024-08-13 DIAGNOSIS — K2270 Barrett's esophagus without dysplasia: Secondary | ICD-10-CM | POA: Diagnosis not present

## 2024-08-13 NOTE — Progress Notes (Unsigned)
 Jennifer Bell [id number] 1991/03/21   Chief Complaint:  Referring Provider: Curtis Debby JINNY DEWAINE Primary GI MD: Sampson (previous Dr. Obie)  HPI: Jennifer Bell is a 33 y.o. female with past medical history of anxiety/depression, GERD, Barrett's esophagus, hiatal hernia who presents today for a complaint of *** .    Referred by OB/GYN following visit 04/2024 for Barrett's esophagus which has not been followed up on in years.  Had biopsy-proven Barrett's in 2015 and was advised to have EGD 2 years later but has not had this done.  Last seen in office by Dr. Obie 07/29/2014 for follow-up of EGD which showed intestinal metaplasia consistent with Barrett's esophagus.  H. pylori was negative.  She had a 3 cm hiatal hernia, had been on omeprazole  40 mg twice daily and Carafate  slurry with modest improvement only.  Barium esophagram 06/17/2014 showed normal motility, no reflux, and a small reducible hiatal hernia as well as transient delay of the 13 mm barium tablet at the aortic arch. At that time was advised to try Protonix  40 mg twice daily and instead of Carafate  slurry switch to Gaviscon 2 tablets after meals and when necessary due to cost. Recall upper endoscopy in 2 years.  Maternal grandfather had colon cancer Mom and uncle had polyps removed  Takes pepcid  for hives     Discussed the use of AI scribe software for clinical note transcription with the patient, who gave verbal consent to proceed.  History of Present Illness Jennifer Bell is a 33 year old female who presents for evaluation of upper endoscopy and colonoscopy due to family history of colon cancer. She was referred by her OB GYN for evaluation of her need for an upper endoscopy and colonoscopy.  Colorectal cancer screening indication - Family history of colon cancer in maternal grandfather - Mother and uncle with history of colonic polyps; mother first diagnosed with polyps approximately five  years ago - No current gastrointestinal symptoms such as changes in bowel movements or blood in stool - Referred by OB GYN for evaluation of need for upper endoscopy and colonoscopy  Barrett's esophagus and gastroesophageal reflux symptoms - History of Barrett's esophagus diagnosed during endoscopy in 2015 - History of hiatal hernia - Reflux symptoms were only severe during pregnancies - No significant current reflux or heartburn - No dysphagia or odynophagia - No abdominal pain except occasional discomfort after certain foods (coffee, apples)  Bowel habits and gastrointestinal symptoms - Daily bowel movements - Diarrhea occurs after coffee consumption, described as 'looser side' rather than watery - Occasional crampy abdominal pain and urgent bowel movements after consuming coffee or apples - No blood in stool, melena, or painful bowel movements  Chronic urticaria - Daily hives for one and a half years - Currently taking Pepcid , Zyrtec , and Singulair  for symptom management  Anxiety and procedural sedation concerns - Anxiety related to medical procedures, particularly with sedation - History of low blood pressure following procedures with sedation  Family history of gastrointestinal disease - Mother and grandfather with Barrett's esophagus - No known family history of esophageal or gastric cancer - Does not smoke, unlike other family members - Concerned about hereditary risk of gastrointestinal conditions      Previous GI Procedures/Imaging   Barium esophagram 06/17/2014 1. Small reducible hiatal hernia. No gastroesophageal reflux or  stricture identified.  2. Normal esophageal motility.  3. Transient delay of the barium tablet at the level of the aortic  arch, likely physiologic in light of recent  normal endoscopy.   EGD 06/11/2014 1. 2- 3 cm partially reducible hiatal hernia with no evidence of esophagitis or Cameron erosions  2. bile reflux  3. No evidence of esophageal  stricture. Normal squamocolumnar junction. Status post biopsies Path: Surgical [P], GE junction, biopsy - INTESTINAL METAPLASIA (GOBLET CELL METAPLASIA) CONSISTENT WITH BARRETT'S ESOPHAGUS. NO DYSPLASIA OR MALIGNANCY IDENTIFIED.  Past Medical History:  Diagnosis Date   Anxiety    Barrett esophagus    Depression    GAD (generalized anxiety disorder)    GERD (gastroesophageal reflux disease)    Heart palpitations    Hiatal hernia    Hives    Idiopathic urticaria    Migraine     Past Surgical History:  Procedure Laterality Date   MYRINGOTOMY WITH TUBE PLACEMENT     TONSILLECTOMY     WISDOM TOOTH EXTRACTION      Current Outpatient Medications  Medication Sig Dispense Refill   Azelastine -Fluticasone  137-50 MCG/ACT SUSP Place 1 puff into the nose in the morning and at bedtime. 23 g 0   cetirizine  (ZYRTEC ) 10 MG tablet Take 1 tablet (10 mg total) by mouth daily. 30 tablet 2   famotidine  (PEPCID ) 20 MG tablet Take 1 tablet (20 mg total) by mouth 2 (two) times daily. 180 tablet 3   montelukast  (SINGULAIR ) 10 MG tablet Take 1 tablet (10 mg total) by mouth at bedtime. 90 tablet 3   atomoxetine  (STRATTERA ) 10 MG capsule Take 1 capsule (10 mg total) by mouth daily. (Patient not taking: Reported on 08/13/2024) 30 capsule 0   No current facility-administered medications for this visit.    Allergies as of 08/13/2024 - Review Complete 08/13/2024  Allergen Reaction Noted   Amoxicillin -pot clavulanate Other (See Comments) 02/26/2015    Family History  Problem Relation Age of Onset   Vitamin D  deficiency Mother    Diabetes Maternal Grandmother    Diabetes Maternal Grandfather    Lung cancer Maternal Grandfather    Colon cancer Maternal Grandfather    Breast cancer Paternal Grandmother    Non-Hodgkin's lymphoma Paternal Grandfather    Depression Paternal Aunt     Social History   Tobacco Use   Smoking status: Never   Smokeless tobacco: Never  Vaping Use   Vaping status: Never  Used  Substance Use Topics   Alcohol use: Yes    Comment: sometimes   Drug use: No     Review of Systems:    Constitutional: No weight loss, fever, chills, weakness or fatigue Eyes: No change in vision Ears, Nose, Throat:  No change in hearing or congestion Skin: No rash or itching Cardiovascular: No chest pain, chest pressure or palpitations   Respiratory: No SOB or cough Gastrointestinal: See HPI and otherwise negative Genitourinary: No dysuria or change in urinary frequency Neurological: No headache, dizziness or syncope Musculoskeletal: No new muscle or joint pain Hematologic: No bleeding or bruising    Physical Exam:  Vital signs: BP 106/72   Pulse 80   Ht 5' 7 (1.702 m)   Wt 148 lb (67.1 kg)   LMP 07/23/2024 (Approximate)   SpO2 98%   BMI 23.18 kg/m   Constitutional: NAD, Well developed, Well nourished, alert and cooperative Head:  Normocephalic and atraumatic.  Eyes: No scleral icterus. Conjunctiva pink. Mouth: No oral lesions. Respiratory: Respirations even and unlabored. Lungs clear to auscultation bilaterally.  No wheezes, crackles, or rhonchi.  Cardiovascular:  Regular rate and rhythm. No murmurs. No peripheral edema. Gastrointestinal:  Soft, nondistended, nontender. No rebound  or guarding. Normal bowel sounds. No appreciable masses or hepatomegaly. Rectal:  Not performed.  Neurologic:  Alert and oriented x4;  grossly normal neurologically.  Skin:   Dry and intact without significant lesions or rashes. Psychiatric: Oriented to person, place and time. Demonstrates good judgement and reason without abnormal affect or behaviors.   RELEVANT LABS AND IMAGING: CBC    Component Value Date/Time   WBC 6.7 07/19/2024 1603   WBC 7.0 04/11/2023 1404   RBC 4.53 07/19/2024 1603   RBC 4.36 04/11/2023 1404   HGB 13.5 07/19/2024 1603   HCT 40.1 07/19/2024 1603   PLT 304 07/19/2024 1603   MCV 89 07/19/2024 1603   MCH 29.8 07/19/2024 1603   MCH 29.8 04/11/2023 1404    MCHC 33.7 07/19/2024 1603   MCHC 34.2 04/11/2023 1404   RDW 12.0 07/19/2024 1603   LYMPHSABS 1.7 07/19/2024 1603   MONOABS 0.5 02/03/2015 1630   EOSABS 0.4 07/19/2024 1603   BASOSABS 0.0 07/19/2024 1603    CMP     Component Value Date/Time   NA 141 07/19/2024 1603   K 3.9 07/19/2024 1603   CL 105 07/19/2024 1603   CO2 21 07/19/2024 1603   GLUCOSE 89 07/19/2024 1603   GLUCOSE 85 07/22/2022 0834   BUN 7 07/19/2024 1603   CREATININE 0.75 07/19/2024 1603   CREATININE 0.73 07/22/2022 0834   CALCIUM 9.3 07/19/2024 1603   PROT 7.3 07/19/2024 1603   ALBUMIN 4.7 07/19/2024 1603   AST 14 07/19/2024 1603   ALT 8 07/19/2024 1603   ALKPHOS 68 07/19/2024 1603   BILITOT 0.4 07/19/2024 1603   GFRNONAA >60 12/22/2015 0834   GFRAA >60 12/22/2015 0834     Assessment/Plan:    Assessment and Plan Assessment & Plan Barrett's esophagus and hiatal hernia Barrett's esophagus diagnosed in 2015, asymptomatic. Hiatal hernia present. No family history of esophageal or stomach cancer. Monitoring necessary due to potential for resolution. Explained risks of upper endoscopy. - Scheduled upper endoscopy with Dr. Ivana. - Initiate proton pump inhibitor therapy if Barrett's esophagus is confirmed.  Gastroesophageal reflux disease (GERD) GERD symptoms controlled with Pepcid . Potential silent reflux contributing to Barrett's esophagus. - Continue Pepcid . - Initiate proton pump inhibitor therapy if Barrett's esophagus is confirmed.  Chronic urticaria Symptoms managed with Pepcid , Zyrtec , and Singulair . - Continue Pepcid , Zyrtec , and Singulair .  Anxiety related to medical procedures Significant anxiety related to procedures, especially endoscopy. Preference for sedation before procedures. - Informed endoscopy team of anxiety and sedation preference. - Scheduled endoscopy with Dr. Ivana.  Repeat EGD with Nandigam    Likely needs colonoscopy at age 28 based on family history   Camie Furbish, PA-C [facility] 08/13/2024, 9:53 AM  Patient Care Team: Lowella Benton CROME, [place] as PCP - General (Physician Assistant)

## 2024-08-13 NOTE — Patient Instructions (Addendum)
 _______________________________________________________  If your blood pressure at your visit was 140/90 or greater, please contact your primary care physician to follow up on this.  _______________________________________________________  If you are age 33 or older, your body mass index should be between 23-30. Your Body mass index is 23.18 kg/m. If this is out of the aforementioned range listed, please consider follow up with your Primary Care Provider.  If you are age 26 or younger, your body mass index should be between 19-25. Your Body mass index is 23.18 kg/m. If this is out of the aformentioned range listed, please consider follow up with your Primary Care Provider.   ________________________________________________________  The [facility] providers would like to encourage you to use MYCHART to communicate with providers for non-urgent requests or questions.  Due to long hold times on the telephone, sending your provider a message by [facility] may be a faster and more efficient way to get a response.  Please allow 48 business hours for a response.  Please remember that this is for non-urgent requests.  _______________________________________________________  [facility] is using a team-based approach to care.  Your team is made up of your doctor and two to three APPS. Our APPS (Nurse Practitioners and Physician Assistants) work with your physician to ensure care continuity for you. They are fully qualified to address your health concerns and develop a treatment plan. They communicate directly with your gastroenterologist to care for you. Seeing the Advanced Practice Practitioners on your physician's team can help you by facilitating care more promptly, often allowing for earlier appointments, access to diagnostic testing, procedures, and other specialty referrals.   You have been scheduled for an endoscopy. Please follow written instructions given to you at your visit  today.  If you use inhalers (even only as needed), please bring them with you on the day of your procedure.  If you take any of the following medications, they will need to be adjusted prior to your procedure:   DO NOT TAKE 7 DAYS PRIOR TO TEST- Trulicity (dulaglutide) Ozempic, Wegovy (semaglutide) Mounjaro, Zepbound (tirzepatide) Bydureon Bcise (exanatide extended release)  DO NOT TAKE 1 DAY PRIOR TO YOUR TEST Rybelsus (semaglutide) Adlyxin (lixisenatide) Victoza (liraglutide) Byetta (exanatide) ___________________________________________________________________________   It was a pleasure to see you today!  Thank you for trusting me with your gastrointestinal care!

## 2024-08-15 ENCOUNTER — Telehealth: Payer: Self-pay | Admitting: Gastroenterology

## 2024-08-15 ENCOUNTER — Encounter: Payer: Self-pay | Admitting: Gastroenterology

## 2024-08-15 NOTE — Telephone Encounter (Signed)
 Left message on machine to call back

## 2024-08-15 NOTE — Telephone Encounter (Signed)
 Please contact patient and ask her to discuss further with her mother regarding mother's history of colon polyps.  Patient would be due for early colon cancer screening if her mother has a documented advanced colon polyp:  Documented advanced polyp with ANY of the following:  Advanced adenoma  -Adenoma size >=1 cm  -Adenoma with high-grade dysplasia  -Adenoma with villous or tubulovillous histology  Advanced serrated lesion  -Sessile serrated polyp (SSP) >=1 cm  -Traditional serrated adenoma >=1 cm  -SSP with cytologic dysplasia

## 2024-08-16 NOTE — Telephone Encounter (Signed)
 Left message on machine to call back

## 2024-08-19 NOTE — Telephone Encounter (Signed)
 Left message on machine to call back  Unable to reach pt by phone I have sent a My Chart message and mailed a letter

## 2024-09-23 ENCOUNTER — Encounter: Admitting: Gastroenterology

## 2024-11-22 ENCOUNTER — Encounter: Admitting: Gastroenterology
